# Patient Record
Sex: Male | Born: 1950 | Race: White | Hispanic: No | Marital: Married | Smoking: Never smoker
Health system: Southern US, Community
[De-identification: ages and names within clinical notes are randomized; demographics above are authoritative.]

## PROBLEM LIST (undated history)

## (undated) DIAGNOSIS — E119 Type 2 diabetes mellitus without complications: Secondary | ICD-10-CM

## (undated) DIAGNOSIS — I10 Essential (primary) hypertension: Secondary | ICD-10-CM

## (undated) DIAGNOSIS — E210 Primary hyperparathyroidism: Secondary | ICD-10-CM

## (undated) HISTORY — PX: VASECTOMY: SHX75

## (undated) HISTORY — PX: HERNIA REPAIR: SHX51

## (undated) HISTORY — PX: APPENDECTOMY: SHX54

## (undated) HISTORY — PX: PARATHYROIDECTOMY: SHX19

---

## 2016-03-04 DIAGNOSIS — I10 Essential (primary) hypertension: Secondary | ICD-10-CM | POA: Insufficient documentation

## 2016-03-04 DIAGNOSIS — R399 Unspecified symptoms and signs involving the genitourinary system: Secondary | ICD-10-CM | POA: Insufficient documentation

## 2016-03-05 DIAGNOSIS — Z0000 Encounter for general adult medical examination without abnormal findings: Secondary | ICD-10-CM | POA: Insufficient documentation

## 2016-03-05 DIAGNOSIS — Z1211 Encounter for screening for malignant neoplasm of colon: Secondary | ICD-10-CM | POA: Insufficient documentation

## 2016-03-05 DIAGNOSIS — Z125 Encounter for screening for malignant neoplasm of prostate: Secondary | ICD-10-CM | POA: Insufficient documentation

## 2016-03-05 DIAGNOSIS — N200 Calculus of kidney: Secondary | ICD-10-CM | POA: Insufficient documentation

## 2016-03-18 DIAGNOSIS — R7301 Impaired fasting glucose: Secondary | ICD-10-CM | POA: Insufficient documentation

## 2016-03-18 DIAGNOSIS — Z13220 Encounter for screening for lipoid disorders: Secondary | ICD-10-CM | POA: Insufficient documentation

## 2016-03-19 DIAGNOSIS — R7989 Other specified abnormal findings of blood chemistry: Secondary | ICD-10-CM | POA: Insufficient documentation

## 2016-03-19 DIAGNOSIS — R945 Abnormal results of liver function studies: Secondary | ICD-10-CM | POA: Insufficient documentation

## 2016-03-22 DIAGNOSIS — E782 Mixed hyperlipidemia: Secondary | ICD-10-CM | POA: Insufficient documentation

## 2016-11-15 DIAGNOSIS — F4329 Adjustment disorder with other symptoms: Secondary | ICD-10-CM | POA: Insufficient documentation

## 2016-12-18 ENCOUNTER — Encounter: Payer: Self-pay | Admitting: Sports Medicine

## 2016-12-18 ENCOUNTER — Ambulatory Visit (INDEPENDENT_AMBULATORY_CARE_PROVIDER_SITE_OTHER): Payer: 59 | Admitting: Sports Medicine

## 2016-12-18 DIAGNOSIS — L84 Corns and callosities: Secondary | ICD-10-CM | POA: Diagnosis not present

## 2016-12-18 DIAGNOSIS — M2041 Other hammer toe(s) (acquired), right foot: Secondary | ICD-10-CM | POA: Diagnosis not present

## 2016-12-18 NOTE — Progress Notes (Signed)
  Subjective: Phillip Saunders is a 66 y.o. male patient who presents to office for evaluation of Right foot pain secondary to callus skin at tip of 4th toe. Patient complains of pain at the lesion present Right foot. Patient has tried self trimming with no relief in symptoms. Patient denies any other pedal complaints.   Patient Active Problem List   Diagnosis Date Noted  . Stress and adjustment reaction 11/15/2016  . Hyperlipidemia, mixed 03/22/2016  . Abnormal liver function test 03/19/2016  . Fasting hyperglycemia 03/18/2016  . Screening for lipid disorders 03/18/2016  . Nephrolithiasis 03/05/2016  . Screening for colon cancer 03/05/2016  . Screening for prostate cancer 03/05/2016  . Wellness examination 03/05/2016  . Benign hypertension 03/04/2016  . Lower urinary tract symptoms (LUTS) 03/04/2016    No current outpatient prescriptions on file prior to visit.   No current facility-administered medications on file prior to visit.     No Known Allergies  Objective:  General: Alert and oriented x3 in no acute distress  Dermatology: Keratotic lesion present -at nail margin at lateral aspect of right 4th toe with skin lines transversing the lesion resembling lister corn, pain is present with direct pressure to the lesion with a central nucleated core noted, no webspace macerations, no ecchymosis bilateral, all nails x 10 are well manicured.  Vascular: Dorsalis Pedis and Posterior Tibial pedal pulses 1/4, Capillary Fill Time 3 seconds, + pedal hair growth bilateral, no edema bilateral lower extremities, Temperature gradient within normal limits.  Neurology: Johney Maine sensation intact via light touch bilateral.  Musculoskeletal: Mild tenderness with palpation at the keratotic lesion site on Right with varus hammertoe, Muscular strength 5/5 in all groups without pain or limitation on range of motion.   Assessment and Plan: Problem List Items Addressed This Visit    None    Visit Diagnoses     Corn    -  Primary   Hammertoe of right foot          -Complete examination performed -Discussed treatment options; patient would like to consider derotational arthropathy with possible pin or distal toe amputation  -Parred keratoic lesion using a chisel blade -Gave silicone toe cap and or crest pad to try -Encouraged daily skin emollients -Encouraged use of pumice stone -Advised good supportive shoes and inserts -Patient to return to office as needed or sooner if condition worsens.  Landis Martins, DPM

## 2018-03-12 ENCOUNTER — Ambulatory Visit: Payer: 59 | Admitting: Sports Medicine

## 2018-03-12 ENCOUNTER — Ambulatory Visit (INDEPENDENT_AMBULATORY_CARE_PROVIDER_SITE_OTHER): Payer: 59

## 2018-03-12 ENCOUNTER — Encounter: Payer: Self-pay | Admitting: Sports Medicine

## 2018-03-12 ENCOUNTER — Other Ambulatory Visit: Payer: Self-pay | Admitting: Sports Medicine

## 2018-03-12 DIAGNOSIS — M79671 Pain in right foot: Secondary | ICD-10-CM

## 2018-03-12 DIAGNOSIS — M722 Plantar fascial fibromatosis: Secondary | ICD-10-CM

## 2018-03-12 MED ORDER — TRIAMCINOLONE ACETONIDE 40 MG/ML IJ SUSP: 20.0000 mg | Freq: Once | INTRAMUSCULAR | Status: DC

## 2018-03-12 MED ORDER — DICLOFENAC SODIUM 75 MG PO TBEC: 75.0000 mg | DELAYED_RELEASE_TABLET | Freq: Two times a day (BID) | ORAL | 0 refills | Status: DC

## 2018-03-12 NOTE — Patient Instructions (Signed)

## 2018-03-12 NOTE — Progress Notes (Signed)
Subjective: Phillip Saunders is a 67 y.o. male patient presents to office with complaint of moderate heel pain on the 4 weeks right. Patient admits to post static dyskinesia for 4 weeks in duration.  Reports that pain sometimes is so bad that he cannot even put his heel down worse in the morning rates pain 5 out of 10.  Patient has treated this problem with nothing besides taking his wife's diclofenac which seems to help. Denies any other pedal complaints.   Patient Active Problem List   Diagnosis Date Noted  . Stress and adjustment reaction 11/15/2016  . Hyperlipidemia, mixed 03/22/2016  . Abnormal liver function test 03/19/2016  . Fasting hyperglycemia 03/18/2016  . Screening for lipid disorders 03/18/2016  . Nephrolithiasis 03/05/2016  . Screening for colon cancer 03/05/2016  . Screening for prostate cancer 03/05/2016  . Wellness examination 03/05/2016  . Benign hypertension 03/04/2016  . Lower urinary tract symptoms (LUTS) 03/04/2016    Current Outpatient Medications on File Prior to Visit  Medication Sig Dispense Refill  . atorvastatin (LIPITOR) 40 MG tablet Take 40 mg by mouth.    . valsartan (DIOVAN) 160 MG tablet      No current facility-administered medications on file prior to visit.     No Known Allergies  Objective: Physical Exam General: The patient is alert and oriented x3 in no acute distress.  Dermatology: Skin is warm, dry and supple bilateral lower extremities. Nails 1-10 are normal. There is no erythema, edema, no eccymosis, no open lesions present. Integument is otherwise unremarkable.  Vascular: Dorsalis Pedis pulse and Posterior Tibial pulse are 1/4 bilateral. Capillary fill time is immediate to all digits.  Neurological: Grossly intact to light touch with an achilles reflex of +2/5 and a  negative Tinel's sign bilateral.  Musculoskeletal: Tenderness to palpation at the medial calcaneal tubercale and through the insertion of the plantar fascia on the  left/right foot. No pain with compression of calcaneus bilateral. No pain with tuning fork to calcaneus bilateral. No pain with calf compression bilateral. There is decreased Ankle joint range of motion bilateral. All other joints range of motion within normal limits bilateral.  Hammertoe right strength 5/5 in all groups bilateral.   Gait: Unassisted, Antalgic avoid weight on right heel   Xray, Righ posterior and inferior t foot:  Normal osseous mineralization. Joint spaces preserved. No fracture/dislocation/boney destruction. Calcaneal spur present with mild thickening of plantar fascia. No other soft tissue abnormalities or radiopaque foreign bodies.   Assessment and Plan: Problem List Items Addressed This Visit    None    Visit Diagnoses    Plantar fasciitis of right foot    -  Primary   Relevant Medications   triamcinolone acetonide (KENALOG-40) injection 20 mg   diclofenac (VOLTAREN) 75 MG EC tablet   Foot pain, right       Relevant Medications   triamcinolone acetonide (KENALOG-40) injection 20 mg   diclofenac (VOLTAREN) 75 MG EC tablet      -Complete examination performed.  -Xrays reviewed -Discussed with patient in detail the condition of plantar fasciitis, how this occurs and general treatment options. Explained both conservative and surgical treatments.  -After oral consent and aseptic prep, injected a mixture containing 1 ml of 2%  plain lidocaine, 1 ml 0.5% plain marcaine, 0.5 ml of kenalog 40 and 0.5 ml of dexamethasone phosphate into right heel. Post-injection care discussed with patient.  -Rx Diclofenac to take as instructed -Recommended good supportive shoes and advised use of OTC insert. Explained  to patient that if these orthoses work well, we will continue with these. If these do not improve his condition and  pain, we will consider custom molded orthoses. -Dispensed heel lifts to use as instructed -Explained and dispensed to patient daily stretching  exercises. -Recommend patient to ice affected area 1-2x daily. -Patient to return to office in 4 weeks for follow up or sooner if problems or questions arise.  If pain is still present we will consider reinjection and plantar fascial bracing at next visit.  Landis Martins, DPM

## 2018-04-02 ENCOUNTER — Encounter: Payer: Self-pay | Admitting: Sports Medicine

## 2018-04-02 ENCOUNTER — Ambulatory Visit: Payer: 59 | Admitting: Sports Medicine

## 2018-04-02 DIAGNOSIS — M79671 Pain in right foot: Secondary | ICD-10-CM

## 2018-04-02 DIAGNOSIS — M722 Plantar fascial fibromatosis: Secondary | ICD-10-CM

## 2018-04-02 NOTE — Progress Notes (Signed)
Subjective: Phillip Saunders is a 67 y.o. male returns to office for follow up evaluation after Right heel injection for plantar fasciitis, injection #1 administered 3 weeks ago. Patient states that the injection seems to help his pain which is now resolved.  Patient reports that he finished his diclofenac by mouth and was able to tolerate the medicine very well with no problems or issues.  Reports that he has been compliant with wearing good supportive shoes even when in home and has purchase a new pair for the house.  Denies any recurrence of pain, bruising, swelling, warmth, or any other constitutional symptoms at this time.  Patient denies any recent changes in medications or new problems since last visit.   Patient Active Problem List   Diagnosis Date Noted  . Stress and adjustment reaction 11/15/2016  . Hyperlipidemia, mixed 03/22/2016  . Abnormal liver function test 03/19/2016  . Fasting hyperglycemia 03/18/2016  . Screening for lipid disorders 03/18/2016  . Nephrolithiasis 03/05/2016  . Screening for colon cancer 03/05/2016  . Screening for prostate cancer 03/05/2016  . Wellness examination 03/05/2016  . Benign hypertension 03/04/2016  . Lower urinary tract symptoms (LUTS) 03/04/2016    Current Outpatient Medications on File Prior to Visit  Medication Sig Dispense Refill  . atorvastatin (LIPITOR) 40 MG tablet Take 40 mg by mouth.    . diclofenac (VOLTAREN) 75 MG EC tablet Take 1 tablet (75 mg total) by mouth 2 (two) times daily. 30 tablet 0  . valsartan (DIOVAN) 160 MG tablet      Current Facility-Administered Medications on File Prior to Visit  Medication Dose Route Frequency Provider Last Rate Last Dose  . triamcinolone acetonide (KENALOG-40) injection 20 mg  20 mg Other Once Landis Martins, DPM        No Known Allergies  Objective:   General:  Alert and oriented x 3, in no acute distress  Dermatology: Skin is warm, dry, and supple bilateral. Nails are within normal limits.  There is no lower extremity erythema, no eccymosis, no open lesions present bilateral.   Vascular: Dorsalis Pedis and Posterior Tibial pedal pulses are 2/4 bilateral. + hair growth noted bilateral. Capillary Fill Time is 3 seconds in all digits. No varicosities, No edema bilateral lower extremities.   Neurological: Sensation grossly intact to light touch with an achilles reflex of +2 and a  negative Tinel's sign bilateral. Vibratory, sharp/dull, Semmes Weinstein Monofilament within normal limits.   Musculoskeletal: There is no tenderness to palpation at the medial calcaneal tubercale and through the insertion of the plantar fascia on the right foot. No pain with compression to calcaneus or application of tuning fork. There is decreased Ankle joint range of motion bilateral. All other jointsrange of motion  within normal limits bilateral. Strength 5/5 bilateral.   Assessment and Plan: Problem List Items Addressed This Visit    None    Visit Diagnoses    Plantar fasciitis of right foot    -  Primary   Foot pain, right          -Complete examination performed.  -Previous x-rays reviewed. -Re-Discussed with patient in detail the condition of plantar fasciitis, how this  occurs related to the foot type of the patient and general treatment options. -Discussed habits for prevention of recurrence of plantar fasciitis -No reinjection at this visit since pain and symptoms are resolved -Continue with stretching, icing as needed, and good supportive shoes, inserts daily.  -Advised patient if pain recurs to resume icing and return to  office as soon as possible -Patient to return to office as needed or sooner if problems or questions arise.  Landis Martins, DPM

## 2021-07-06 ENCOUNTER — Other Ambulatory Visit: Payer: Self-pay

## 2021-07-06 ENCOUNTER — Inpatient Hospital Stay (HOSPITAL_COMMUNITY)
Admission: EM | Admit: 2021-07-06 | Discharge: 2021-07-14 | DRG: 435 | Disposition: A | Discharge status: Home / Self Care | Payer: Medicare HMO | Attending: Internal Medicine | Admitting: Internal Medicine

## 2021-07-06 ENCOUNTER — Encounter (HOSPITAL_COMMUNITY): Payer: Self-pay | Admitting: Emergency Medicine

## 2021-07-06 ENCOUNTER — Emergency Department (HOSPITAL_COMMUNITY): Payer: Medicare HMO

## 2021-07-06 DIAGNOSIS — D751 Secondary polycythemia: Secondary | ICD-10-CM | POA: Diagnosis not present

## 2021-07-06 DIAGNOSIS — K7469 Other cirrhosis of liver: Principal | ICD-10-CM | POA: Diagnosis present

## 2021-07-06 DIAGNOSIS — R978 Other abnormal tumor markers: Secondary | ICD-10-CM

## 2021-07-06 DIAGNOSIS — R7303 Prediabetes: Secondary | ICD-10-CM | POA: Diagnosis present

## 2021-07-06 DIAGNOSIS — K766 Portal hypertension: Secondary | ICD-10-CM | POA: Diagnosis present

## 2021-07-06 DIAGNOSIS — E861 Hypovolemia: Secondary | ICD-10-CM | POA: Diagnosis present

## 2021-07-06 DIAGNOSIS — K838 Other specified diseases of biliary tract: Secondary | ICD-10-CM | POA: Diagnosis present

## 2021-07-06 DIAGNOSIS — N179 Acute kidney failure, unspecified: Secondary | ICD-10-CM | POA: Diagnosis not present

## 2021-07-06 DIAGNOSIS — Z20822 Contact with and (suspected) exposure to covid-19: Secondary | ICD-10-CM | POA: Diagnosis present

## 2021-07-06 DIAGNOSIS — R17 Unspecified jaundice: Secondary | ICD-10-CM

## 2021-07-06 DIAGNOSIS — E871 Hypo-osmolality and hyponatremia: Secondary | ICD-10-CM | POA: Diagnosis present

## 2021-07-06 DIAGNOSIS — R970 Elevated carcinoembryonic antigen [CEA]: Secondary | ICD-10-CM

## 2021-07-06 DIAGNOSIS — Z7984 Long term (current) use of oral hypoglycemic drugs: Secondary | ICD-10-CM

## 2021-07-06 DIAGNOSIS — E669 Obesity, unspecified: Secondary | ICD-10-CM | POA: Diagnosis present

## 2021-07-06 DIAGNOSIS — K859 Acute pancreatitis without necrosis or infection, unspecified: Secondary | ICD-10-CM | POA: Diagnosis not present

## 2021-07-06 DIAGNOSIS — E872 Acidosis: Secondary | ICD-10-CM | POA: Diagnosis not present

## 2021-07-06 DIAGNOSIS — C221 Intrahepatic bile duct carcinoma: Principal | ICD-10-CM | POA: Diagnosis present

## 2021-07-06 DIAGNOSIS — R161 Splenomegaly, not elsewhere classified: Secondary | ICD-10-CM | POA: Diagnosis present

## 2021-07-06 DIAGNOSIS — R109 Unspecified abdominal pain: Secondary | ICD-10-CM

## 2021-07-06 DIAGNOSIS — Z803 Family history of malignant neoplasm of breast: Secondary | ICD-10-CM

## 2021-07-06 DIAGNOSIS — K8021 Calculus of gallbladder without cholecystitis with obstruction: Secondary | ICD-10-CM | POA: Diagnosis present

## 2021-07-06 DIAGNOSIS — R1010 Upper abdominal pain, unspecified: Secondary | ICD-10-CM

## 2021-07-06 DIAGNOSIS — R160 Hepatomegaly, not elsewhere classified: Secondary | ICD-10-CM

## 2021-07-06 DIAGNOSIS — Z79899 Other long term (current) drug therapy: Secondary | ICD-10-CM

## 2021-07-06 DIAGNOSIS — D696 Thrombocytopenia, unspecified: Secondary | ICD-10-CM | POA: Diagnosis present

## 2021-07-06 DIAGNOSIS — R7401 Elevation of levels of liver transaminase levels: Secondary | ICD-10-CM

## 2021-07-06 DIAGNOSIS — K7460 Unspecified cirrhosis of liver: Secondary | ICD-10-CM | POA: Diagnosis present

## 2021-07-06 DIAGNOSIS — C220 Liver cell carcinoma: Secondary | ICD-10-CM | POA: Diagnosis present

## 2021-07-06 DIAGNOSIS — E1165 Type 2 diabetes mellitus with hyperglycemia: Secondary | ICD-10-CM | POA: Diagnosis present

## 2021-07-06 DIAGNOSIS — E210 Primary hyperparathyroidism: Secondary | ICD-10-CM | POA: Diagnosis present

## 2021-07-06 DIAGNOSIS — R110 Nausea: Secondary | ICD-10-CM

## 2021-07-06 DIAGNOSIS — I10 Essential (primary) hypertension: Secondary | ICD-10-CM | POA: Diagnosis present

## 2021-07-06 DIAGNOSIS — E785 Hyperlipidemia, unspecified: Secondary | ICD-10-CM | POA: Diagnosis present

## 2021-07-06 DIAGNOSIS — K839 Disease of biliary tract, unspecified: Secondary | ICD-10-CM | POA: Diagnosis present

## 2021-07-06 DIAGNOSIS — E119 Type 2 diabetes mellitus without complications: Secondary | ICD-10-CM

## 2021-07-06 DIAGNOSIS — E860 Dehydration: Secondary | ICD-10-CM | POA: Diagnosis present

## 2021-07-06 HISTORY — DX: Essential (primary) hypertension: I10

## 2021-07-06 HISTORY — DX: Type 2 diabetes mellitus without complications: E11.9

## 2021-07-06 HISTORY — DX: Primary hyperparathyroidism: E21.0

## 2021-07-06 LAB — CBC WITH DIFFERENTIAL/PLATELET
Abs Immature Granulocytes: 0.02 10*3/uL (ref 0.00–0.07)
Basophils Absolute: 0 10*3/uL (ref 0.0–0.1)
Basophils Relative: 1 %
Eosinophils Absolute: 0.1 10*3/uL (ref 0.0–0.5)
Eosinophils Relative: 2 %
HCT: 43.1 % (ref 39.0–52.0)
Hemoglobin: 14.7 g/dL (ref 13.0–17.0)
Immature Granulocytes: 0 %
Lymphocytes Relative: 15 %
Lymphs Abs: 0.8 10*3/uL (ref 0.7–4.0)
MCH: 32.7 pg (ref 26.0–34.0)
MCHC: 34.1 g/dL (ref 30.0–36.0)
MCV: 96 fL (ref 80.0–100.0)
Monocytes Absolute: 0.5 10*3/uL (ref 0.1–1.0)
Monocytes Relative: 9 %
Neutro Abs: 3.8 10*3/uL (ref 1.7–7.7)
Neutrophils Relative %: 73 %
Platelets: 90 10*3/uL — ABNORMAL LOW (ref 150–400)
RBC: 4.49 MIL/uL (ref 4.22–5.81)
RDW: 14 % (ref 11.5–15.5)
WBC: 5.2 10*3/uL (ref 4.0–10.5)
nRBC: 0 % (ref 0.0–0.2)

## 2021-07-06 LAB — COMPREHENSIVE METABOLIC PANEL
ALT: 149 U/L — ABNORMAL HIGH (ref 0–44)
AST: 126 U/L — ABNORMAL HIGH (ref 15–41)
Albumin: 3.1 g/dL — ABNORMAL LOW (ref 3.5–5.0)
Alkaline Phosphatase: 229 U/L — ABNORMAL HIGH (ref 38–126)
Anion gap: 7 (ref 5–15)
BUN: 16 mg/dL (ref 8–23)
CO2: 22 mmol/L (ref 22–32)
Calcium: 8.5 mg/dL — ABNORMAL LOW (ref 8.9–10.3)
Chloride: 102 mmol/L (ref 98–111)
Creatinine, Ser: 0.85 mg/dL (ref 0.61–1.24)
GFR, Estimated: >60 mL/min (ref >60)
Glucose, Bld: 173 mg/dL — ABNORMAL HIGH (ref 70–99)
Potassium: 3.5 mmol/L (ref 3.5–5.1)
Sodium: 131 mmol/L — ABNORMAL LOW (ref 135–145)
Total Bilirubin: 7 mg/dL — ABNORMAL HIGH (ref 0.3–1.2)
Total Protein: 7.3 g/dL (ref 6.5–8.1)

## 2021-07-06 LAB — URINALYSIS, ROUTINE W REFLEX MICROSCOPIC
Glucose, UA: NEGATIVE mg/dL
Hgb urine dipstick: NEGATIVE
Ketones, ur: NEGATIVE mg/dL
Nitrite: NEGATIVE
Protein, ur: NEGATIVE mg/dL
Specific Gravity, Urine: 1.039 — ABNORMAL HIGH (ref 1.005–1.030)
pH: 5 (ref 5.0–8.0)

## 2021-07-06 LAB — RESP PANEL BY RT-PCR (FLU A&B, COVID) ARPGX2
Influenza A by PCR: NEGATIVE
Influenza B by PCR: NEGATIVE
SARS Coronavirus 2 by RT PCR: NEGATIVE

## 2021-07-06 LAB — LIPASE, BLOOD: Lipase: 51 U/L (ref 11–51)

## 2021-07-06 MED ORDER — LACTATED RINGERS IV SOLN: INTRAVENOUS | Status: DC

## 2021-07-06 MED ORDER — INSULIN ASPART 100 UNIT/ML IJ SOLN: 0.0000 [IU] | Freq: Three times a day (TID) | INTRAMUSCULAR | Status: DC

## 2021-07-06 MED ORDER — IOHEXOL 350 MG/ML SOLN
100.0000 mL | Freq: Once | INTRAVENOUS | Status: AC | PRN
  Administered 2021-07-06: 100 mL via INTRAVENOUS

## 2021-07-06 MED ORDER — IRBESARTAN 150 MG PO TABS
150.0000 mg | ORAL_TABLET | Freq: Every day | ORAL | Status: DC
  Administered 2021-07-07 – 2021-07-12 (×5): 150 mg via ORAL
  Filled 2021-07-06 (×5): qty 1

## 2021-07-06 MED ORDER — SODIUM CHLORIDE 0.9 % IV BOLUS
1000.0000 mL | Freq: Once | INTRAVENOUS | Status: AC
  Administered 2021-07-06: 1000 mL via INTRAVENOUS

## 2021-07-06 MED ORDER — ONDANSETRON HCL 4 MG/2ML IJ SOLN
4.0000 mg | Freq: Once | INTRAMUSCULAR | Status: AC
  Administered 2021-07-06: 4 mg via INTRAVENOUS
  Filled 2021-07-06: qty 2

## 2021-07-06 MED ORDER — ATORVASTATIN CALCIUM 40 MG PO TABS
40.0000 mg | ORAL_TABLET | Freq: Every day | ORAL | Status: DC
  Administered 2021-07-06 – 2021-07-13 (×8): 40 mg via ORAL
  Filled 2021-07-06 (×8): qty 1

## 2021-07-06 NOTE — ED Triage Notes (Signed)
Pt here from home after not feeling well w/ diarrhea for 3 days, having abd pain when he eats, increased fatigue. Today pt noticed yellowing of his eyes. Reports general malaise.

## 2021-07-06 NOTE — ED Provider Notes (Signed)
Emergency Medicine Provider Triage Evaluation Note  Phillip Saunders , a 70 y.o. male  was evaluated in triage.  Pt complains of fatigue denies any fever or chills.  Denies any vomiting but does endorse some occasional nausea.  Endorses 2 episodes of diarrhea per day.  States that his loose stool not watery.  Denies any blood in his stool.  Review of Systems  Positive: Nausea, diarrhea, fatigue Negative: Fever  Physical Exam  BP 121/84 (BP Location: Left Arm)   Pulse (!) 59   Temp 99.2 F (37.3 C)   Resp 18   Ht '5\' 3"'$  (1.6 m)   Wt 88.9 kg   SpO2 96%   BMI 34.72 kg/m  Gen:   Awake, no distress   Resp:  Normal effort  MSK:   Moves extremities without difficulty  Other:  Slight icterus.  No significant abdominal tenderness examination.  Medical Decision Making  Medically screening exam initiated at 7:03 PM.  Appropriate orders placed.  Kazumi Brumbley was informed that the remainder of the evaluation will be completed by another provider, this initial triage assessment does not replace that evaluation, and the importance of remaining in the ED until their evaluation is complete.  Is complaining of icterus which is changed on my exam but he states this is new for him.  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  No recent travel no fevers not having significant abdominal pain.  Overall relatively well-appearing.  Vital signs without any tachycardia or hyperthermia.   Tedd Sias, [place] 07/06/21 1914    Arnaldo Natal, MD 07/06/21 [phone]

## 2021-07-06 NOTE — ED Notes (Signed)
Patient transported to CT 

## 2021-07-06 NOTE — ED Provider Notes (Signed)
[facility] EMERGENCY DEPARTMENT Provider Note   CSN: [id number] Arrival date & time: 07/06/21  [id number]     History Chief Complaint  Patient presents with   Jaundice   Diarrhea    Phillip Saunders is a 70 y.o. male.  Patient is a 70 yo male with PMH of HTN, pre-diabetes, and hyperlipidemia presenting for jaundice. Patient admits to generalized abdominal discomfort, nausea without vomiting, and diarrhea x 4 days. Wife noticed discoloration-yellowing of eyes and skin tone this morning. Patient denies previous hx of liver malignancy, family hx of pancreatic cancer, previous dx of hepatitis, daily or binge drinking.     The history is provided by the patient. No language interpreter was used.  Emesis Severity:  Mild Duration:  4 days Timing:  Intermittent Progression:  Unchanged Associated symptoms: no cough and no sore throat       Past Medical History:  Diagnosis Date   Hypertension    Pre-diabetes     Patient Active Problem List   Diagnosis Date Noted   Stress and adjustment reaction 11/15/2016   Hyperlipidemia, mixed 03/22/2016   Abnormal liver function test 03/19/2016   Fasting hyperglycemia 03/18/2016   Screening for lipid disorders 03/18/2016   Nephrolithiasis 03/05/2016   Screening for colon cancer 03/05/2016   Screening for prostate cancer 03/05/2016   Wellness examination 03/05/2016   Benign hypertension 03/04/2016   Lower urinary tract symptoms (LUTS) 03/04/2016    Past Surgical History:  Procedure Laterality Date   HERNIA REPAIR         History reviewed. No pertinent family history.  Social History   Tobacco Use   Smoking status: Never   Smokeless tobacco: Never  Substance Use Topics   Alcohol use: Not Currently   Drug use: Never    Home Medications Prior to Admission medications   Medication Sig Start Date End Date Taking? Authorizing Provider  atorvastatin (LIPITOR) 40 MG tablet Take 40 mg by mouth. 03/22/16   [provider]  diclofenac (VOLTAREN) 75 MG EC tablet Take 1 tablet (75 mg total) by mouth 2 (two) times daily. 03/12/18   Landis Martins, DPM  valsartan (DIOVAN) 160 MG tablet  08/17/16   [provider]    Allergies    Patient has no known allergies.  Review of Systems   Review of Systems  HENT:  Negative for ear pain and sore throat.   Eyes:  Negative for pain and visual disturbance.  Respiratory:  Negative for cough and shortness of breath.   Cardiovascular:  Negative for chest pain and palpitations.  Gastrointestinal:  Positive for nausea and vomiting.  Genitourinary:  Negative for dysuria and hematuria.  Musculoskeletal:  Negative for back pain.  Skin:  Positive for color change. Negative for rash.  Neurological:  Negative for seizures and syncope.  All other systems reviewed and are negative.  Physical Exam Updated Vital Signs BP 121/84 (BP Location: Left Arm)   Pulse (!) 59   Temp 99.2 F (37.3 C)   Resp 18   Ht '5\' 3"'$  (1.6 m)   Wt 88.9 kg   SpO2 96%   BMI 34.72 kg/m   Physical Exam Vitals and nursing note reviewed.  Constitutional:      Appearance: He is well-developed.  HENT:     Head: Normocephalic and atraumatic.  Eyes:     General: Scleral icterus present.     Conjunctiva/sclera: Conjunctivae normal.  Cardiovascular:     Rate and Rhythm: Normal rate and regular  rhythm.     Heart sounds: No murmur heard. Pulmonary:     Effort: Pulmonary effort is normal. No respiratory distress.     Breath sounds: Normal breath sounds.  Abdominal:     Palpations: Abdomen is soft.     Tenderness: There is no abdominal tenderness.  Musculoskeletal:     Cervical back: Neck supple.  Skin:    General: Skin is warm and dry.     Coloration: Skin is jaundiced.  Neurological:     Mental Status: He is alert.    ED Results / Procedures / Treatments   Labs (all labs ordered are listed, but only abnormal results are displayed) Labs Reviewed  RESP PANEL BY RT-PCR  (FLU A&B, COVID) ARPGX2  COMPREHENSIVE METABOLIC PANEL  CBC WITH DIFFERENTIAL/PLATELET  LIPASE, BLOOD  HEPATITIS PANEL, ACUTE  URINALYSIS, ROUTINE W REFLEX MICROSCOPIC    EKG None  Radiology No results found.  Procedures Procedures   Medications Ordered in ED Medications  ondansetron (ZOFRAN) injection 4 mg (has no administration in time range)  sodium chloride 0.9 % bolus 1,000 mL (has no administration in time range)    ED Course  I have reviewed the triage vital signs and the nursing notes.  Pertinent labs & imaging results that were available during my care of the patient were reviewed by me and considered in my medical decision making (see chart for details).    MDM Rules/Calculators/A&P                         8:04 PM  70 yo male with PMH of HTN, pre-diabetes, and hyperlipidemia presenting for jaundice. Patient is Aox3, no acute distress, afebrile, with stable vitals. Abdomen is soft and nontender.    -Lab studies pertinent for transaminitis. Elevated total bilirubin at 7.   -CT abdomen demonstrates: Liver cirrhosis. Interim development of moderate intra hepatic biliary dilatation. Poorly visible extrahepatic common bile duct which does not appear dilated at head of pancreas, findings raise concern for hilar obstructing process though no gross mass seen on CT. There is heterogeneous hyperenhancement of right hepatic lobe indeterminate for mass or altered perfusion. Further evaluation with MRI/MRCP is recommended. 2. Mild porta hepatis adenopathy indeterminate for metastatic disease. 3. Splenomegaly and findings suggestive of portal hypertension  Family up to date. I spoke with GI specialist Dr. Lyndel Safe who recommends MRCP tomorrow. I spoke with hospitalist Dr. Alcario Drought who agrees to accept patient.     Final Clinical Impression(s) / ED Diagnoses Final diagnoses:  Other cirrhosis of liver ([place])  Splenomegaly  Transaminitis  Nausea  Jaundice    Rx / DC  Orders ED Discharge Orders     None        Lianne Cure, DO [id number]

## 2021-07-06 NOTE — ED Triage Notes (Signed)
No answer when name called in waiting

## 2021-07-07 ENCOUNTER — Inpatient Hospital Stay (HOSPITAL_COMMUNITY): Payer: Medicare HMO

## 2021-07-07 DIAGNOSIS — N179 Acute kidney failure, unspecified: Secondary | ICD-10-CM | POA: Diagnosis not present

## 2021-07-07 DIAGNOSIS — E785 Hyperlipidemia, unspecified: Secondary | ICD-10-CM | POA: Diagnosis present

## 2021-07-07 DIAGNOSIS — E860 Dehydration: Secondary | ICD-10-CM | POA: Diagnosis present

## 2021-07-07 DIAGNOSIS — C221 Intrahepatic bile duct carcinoma: Secondary | ICD-10-CM | POA: Diagnosis present

## 2021-07-07 DIAGNOSIS — Z79899 Other long term (current) drug therapy: Secondary | ICD-10-CM | POA: Diagnosis not present

## 2021-07-07 DIAGNOSIS — Z7984 Long term (current) use of oral hypoglycemic drugs: Secondary | ICD-10-CM | POA: Diagnosis not present

## 2021-07-07 DIAGNOSIS — K766 Portal hypertension: Secondary | ICD-10-CM | POA: Diagnosis present

## 2021-07-07 DIAGNOSIS — K851 Biliary acute pancreatitis without necrosis or infection: Secondary | ICD-10-CM | POA: Diagnosis not present

## 2021-07-07 DIAGNOSIS — R1010 Upper abdominal pain, unspecified: Secondary | ICD-10-CM | POA: Diagnosis not present

## 2021-07-07 DIAGNOSIS — R17 Unspecified jaundice: Secondary | ICD-10-CM

## 2021-07-07 DIAGNOSIS — E210 Primary hyperparathyroidism: Secondary | ICD-10-CM | POA: Diagnosis present

## 2021-07-07 DIAGNOSIS — K7460 Unspecified cirrhosis of liver: Secondary | ICD-10-CM

## 2021-07-07 DIAGNOSIS — R7401 Elevation of levels of liver transaminase levels: Secondary | ICD-10-CM | POA: Diagnosis not present

## 2021-07-07 DIAGNOSIS — R160 Hepatomegaly, not elsewhere classified: Secondary | ICD-10-CM | POA: Diagnosis not present

## 2021-07-07 DIAGNOSIS — D751 Secondary polycythemia: Secondary | ICD-10-CM | POA: Diagnosis not present

## 2021-07-07 DIAGNOSIS — K859 Acute pancreatitis without necrosis or infection, unspecified: Secondary | ICD-10-CM | POA: Diagnosis not present

## 2021-07-07 DIAGNOSIS — Z803 Family history of malignant neoplasm of breast: Secondary | ICD-10-CM | POA: Diagnosis not present

## 2021-07-07 DIAGNOSIS — K838 Other specified diseases of biliary tract: Secondary | ICD-10-CM

## 2021-07-07 DIAGNOSIS — Z20822 Contact with and (suspected) exposure to covid-19: Secondary | ICD-10-CM | POA: Diagnosis present

## 2021-07-07 DIAGNOSIS — E871 Hypo-osmolality and hyponatremia: Secondary | ICD-10-CM | POA: Diagnosis present

## 2021-07-07 DIAGNOSIS — R161 Splenomegaly, not elsewhere classified: Secondary | ICD-10-CM | POA: Diagnosis present

## 2021-07-07 DIAGNOSIS — E119 Type 2 diabetes mellitus without complications: Secondary | ICD-10-CM

## 2021-07-07 DIAGNOSIS — R7303 Prediabetes: Secondary | ICD-10-CM | POA: Diagnosis present

## 2021-07-07 DIAGNOSIS — E861 Hypovolemia: Secondary | ICD-10-CM | POA: Diagnosis present

## 2021-07-07 DIAGNOSIS — R970 Elevated carcinoembryonic antigen [CEA]: Secondary | ICD-10-CM | POA: Diagnosis not present

## 2021-07-07 DIAGNOSIS — K839 Disease of biliary tract, unspecified: Secondary | ICD-10-CM | POA: Diagnosis present

## 2021-07-07 DIAGNOSIS — R978 Other abnormal tumor markers: Secondary | ICD-10-CM | POA: Diagnosis not present

## 2021-07-07 DIAGNOSIS — D696 Thrombocytopenia, unspecified: Secondary | ICD-10-CM | POA: Diagnosis present

## 2021-07-07 DIAGNOSIS — E872 Acidosis: Secondary | ICD-10-CM | POA: Diagnosis not present

## 2021-07-07 DIAGNOSIS — E669 Obesity, unspecified: Secondary | ICD-10-CM | POA: Diagnosis present

## 2021-07-07 DIAGNOSIS — E1165 Type 2 diabetes mellitus with hyperglycemia: Secondary | ICD-10-CM | POA: Diagnosis present

## 2021-07-07 DIAGNOSIS — K8021 Calculus of gallbladder without cholecystitis with obstruction: Secondary | ICD-10-CM | POA: Diagnosis present

## 2021-07-07 DIAGNOSIS — K7469 Other cirrhosis of liver: Secondary | ICD-10-CM

## 2021-07-07 DIAGNOSIS — I10 Essential (primary) hypertension: Secondary | ICD-10-CM

## 2021-07-07 DIAGNOSIS — C220 Liver cell carcinoma: Secondary | ICD-10-CM | POA: Diagnosis present

## 2021-07-07 DIAGNOSIS — R109 Unspecified abdominal pain: Secondary | ICD-10-CM | POA: Diagnosis not present

## 2021-07-07 LAB — CBC
HCT: 41.9 % (ref 39.0–52.0)
Hemoglobin: 14.4 g/dL (ref 13.0–17.0)
MCH: 32.5 pg (ref 26.0–34.0)
MCHC: 34.4 g/dL (ref 30.0–36.0)
MCV: 94.6 fL (ref 80.0–100.0)
Platelets: 75 10*3/uL — ABNORMAL LOW (ref 150–400)
RBC: 4.43 MIL/uL (ref 4.22–5.81)
RDW: 14 % (ref 11.5–15.5)
WBC: 4.5 10*3/uL (ref 4.0–10.5)
nRBC: 0 % (ref 0.0–0.2)

## 2021-07-07 LAB — COMPREHENSIVE METABOLIC PANEL
ALT: 143 U/L — ABNORMAL HIGH (ref 0–44)
AST: 125 U/L — ABNORMAL HIGH (ref 15–41)
Albumin: 2.8 g/dL — ABNORMAL LOW (ref 3.5–5.0)
Alkaline Phosphatase: 237 U/L — ABNORMAL HIGH (ref 38–126)
Anion gap: 9 (ref 5–15)
BUN: 12 mg/dL (ref 8–23)
CO2: 21 mmol/L — ABNORMAL LOW (ref 22–32)
Calcium: 8.2 mg/dL — ABNORMAL LOW (ref 8.9–10.3)
Chloride: 102 mmol/L (ref 98–111)
Creatinine, Ser: 0.82 mg/dL (ref 0.61–1.24)
GFR, Estimated: >60 mL/min (ref >60)
Glucose, Bld: 245 mg/dL — ABNORMAL HIGH (ref 70–99)
Potassium: 3.4 mmol/L — ABNORMAL LOW (ref 3.5–5.1)
Sodium: 132 mmol/L — ABNORMAL LOW (ref 135–145)
Total Bilirubin: 7.9 mg/dL — ABNORMAL HIGH (ref 0.3–1.2)
Total Protein: 6.7 g/dL (ref 6.5–8.1)

## 2021-07-07 LAB — HIV ANTIBODY (ROUTINE TESTING W REFLEX): HIV Screen 4th Generation wRfx: NONREACTIVE

## 2021-07-07 LAB — AMMONIA: Ammonia: 76 umol/L — ABNORMAL HIGH (ref 9–35)

## 2021-07-07 LAB — HEPATITIS PANEL, ACUTE
HCV Ab: NONREACTIVE
Hep A IgM: NONREACTIVE
Hep B C IgM: NONREACTIVE
Hepatitis B Surface Ag: NONREACTIVE

## 2021-07-07 LAB — GLUCOSE, CAPILLARY
Glucose-Capillary: 223 mg/dL — ABNORMAL HIGH (ref 70–99)
Glucose-Capillary: 144 mg/dL — ABNORMAL HIGH (ref 70–99)
Glucose-Capillary: 284 mg/dL — ABNORMAL HIGH (ref 70–99)
Glucose-Capillary: 184 mg/dL — ABNORMAL HIGH (ref 70–99)
Glucose-Capillary: 175 mg/dL — ABNORMAL HIGH (ref 70–99)

## 2021-07-07 LAB — HEMOGLOBIN A1C
Hgb A1c MFr Bld: 9.3 % — ABNORMAL HIGH (ref 4.8–5.6)
Mean Plasma Glucose: 220.21 mg/dL

## 2021-07-07 LAB — BILIRUBIN, FRACTIONATED(TOT/DIR/INDIR)
Bilirubin, Direct: 3.8 mg/dL — ABNORMAL HIGH (ref 0.0–0.2)
Indirect Bilirubin: 3.2 mg/dL — ABNORMAL HIGH (ref 0.3–0.9)
Total Bilirubin: 7 mg/dL — ABNORMAL HIGH (ref 0.3–1.2)

## 2021-07-07 LAB — FERRITIN: Ferritin: 764 ng/mL — ABNORMAL HIGH (ref 24–336)

## 2021-07-07 LAB — PROTIME-INR
INR: 1.1 (ref 0.8–1.2)
Prothrombin Time: 14.4 seconds (ref 11.4–15.2)

## 2021-07-07 LAB — APTT: aPTT: 31 seconds (ref 24–36)

## 2021-07-07 MED ORDER — ONDANSETRON HCL 4 MG PO TABS: 4.0000 mg | ORAL_TABLET | Freq: Four times a day (QID) | ORAL | Status: DC | PRN

## 2021-07-07 MED ORDER — INSULIN ASPART 100 UNIT/ML IJ SOLN
2.0000 [IU] | Freq: Three times a day (TID) | INTRAMUSCULAR | Status: DC
  Administered 2021-07-07 – 2021-07-14 (×18): 2 [IU] via SUBCUTANEOUS

## 2021-07-07 MED ORDER — ACETAMINOPHEN 325 MG PO TABS
650.0000 mg | ORAL_TABLET | Freq: Four times a day (QID) | ORAL | Status: DC | PRN
  Administered 2021-07-10 – 2021-07-11 (×3): 650 mg via ORAL
  Filled 2021-07-07 (×3): qty 2

## 2021-07-07 MED ORDER — ENOXAPARIN SODIUM 40 MG/0.4ML IJ SOSY
40.0000 mg | PREFILLED_SYRINGE | Freq: Every day | INTRAMUSCULAR | Status: DC
  Administered 2021-07-07 – 2021-07-08 (×2): 40 mg via SUBCUTANEOUS
  Filled 2021-07-07 (×2): qty 0.4

## 2021-07-07 MED ORDER — LACTULOSE 10 GM/15ML PO SOLN
20.0000 g | Freq: Two times a day (BID) | ORAL | Status: DC
  Administered 2021-07-07 (×2): 20 g via ORAL
  Filled 2021-07-07 (×3): qty 30

## 2021-07-07 MED ORDER — GADOBUTROL 1 MMOL/ML IV SOLN
9.0000 mL | Freq: Once | INTRAVENOUS | Status: AC | PRN
  Administered 2021-07-07: 9 mL via INTRAVENOUS

## 2021-07-07 MED ORDER — ACETAMINOPHEN 650 MG RE SUPP: 650.0000 mg | Freq: Four times a day (QID) | RECTAL | Status: DC | PRN

## 2021-07-07 MED ORDER — SODIUM CHLORIDE 0.9 % IV SOLN: INTRAVENOUS | Status: AC

## 2021-07-07 MED ORDER — LORAZEPAM 1 MG PO TABS
1.0000 mg | ORAL_TABLET | Freq: Four times a day (QID) | ORAL | Status: DC | PRN
  Administered 2021-07-07 – 2021-07-13 (×2): 1 mg via ORAL
  Filled 2021-07-07 (×2): qty 1

## 2021-07-07 MED ORDER — INSULIN ASPART 100 UNIT/ML IJ SOLN: 0.0000 [IU] | Freq: Every day | INTRAMUSCULAR | Status: DC

## 2021-07-07 MED ORDER — ONDANSETRON HCL 4 MG/2ML IJ SOLN
4.0000 mg | Freq: Four times a day (QID) | INTRAMUSCULAR | Status: DC | PRN
  Administered 2021-07-10: 4 mg via INTRAVENOUS
  Filled 2021-07-07: qty 2

## 2021-07-07 MED ORDER — INSULIN ASPART 100 UNIT/ML IJ SOLN
0.0000 [IU] | INTRAMUSCULAR | Status: DC
  Administered 2021-07-07: 3 [IU] via SUBCUTANEOUS

## 2021-07-07 MED ORDER — INSULIN ASPART 100 UNIT/ML IJ SOLN
0.0000 [IU] | Freq: Three times a day (TID) | INTRAMUSCULAR | Status: DC
  Administered 2021-07-07: 3 [IU] via SUBCUTANEOUS
  Administered 2021-07-07: 8 [IU] via SUBCUTANEOUS
  Administered 2021-07-08 (×3): 5 [IU] via SUBCUTANEOUS
  Administered 2021-07-09: 3 [IU] via SUBCUTANEOUS
  Administered 2021-07-09 – 2021-07-10 (×3): 2 [IU] via SUBCUTANEOUS
  Administered 2021-07-11 – 2021-07-13 (×6): 3 [IU] via SUBCUTANEOUS
  Administered 2021-07-13: 2 [IU] via SUBCUTANEOUS
  Administered 2021-07-13 – 2021-07-14 (×3): 3 [IU] via SUBCUTANEOUS

## 2021-07-07 NOTE — Consult Note (Addendum)
[facility] Consult: 10:07 AM 07/07/2021  LOS: 0 days    Referring Provider: Dr Reesa Chew  Primary Care Physician:  Algis Greenhouse, MD Primary Gastroenterologist:  [facility] in [place]     Reason for Consultation: Cirrhosis, hepatocellular carcinoma.  Both new diagnosis   HPI: Phillip Saunders is a 70 y.o. male.  PMH NIDDM.  Primary hyperparathyroidism, parathyroidectomy May 08/2017.Marland Kitchen  HTN.  Previous colonoscopy ~ 5 yr  ago, no polyps.  Left inguinal hernia repair.  2 months of postprandial discomfort with eating solid food, okay if he drinks liquids.  Decreased p.o. intake as a result.  20 pound weight loss.  No abdominal or extremity swelling.  Fatigue without shortness of breath.  No pruritus.  Patient does not drink alcohol.  No history of illicit drug use. Yesterday his family noticed for the first time his jaundice especially scleral icterus and they forced him to come to the emergency room for evaluation.  Labs significant for T bili 7.9.  Alk phos 237.  AST/ALT 125/143.  Hb 14.4.  Platelets 75.  INR 1.1.  Glucose 245. Ending labs include CEA, AFP, CA 19-9. Acute hepatitis panel is in progress.  MRI, MRCP:   Somewhat motion degraded exam.  Area of infiltration and hyper intensity measuring 10.1 x 8.7 in segment 8.  This is likely a tumor.  Moderate intrahepatic biliary ductal dilatation in the left lobe and posterior right hepatic lobe presumably from the right lobe tumor.  Small gallstones, no complication.  Splenomegaly at 15.5 cm.  Patent PV and splenic veins.  Extensive portosystemic collaterals.  1.2 cm periesophageal and 1.5 cm gastrohepatic ligament nodes.  No ascites.  Probable mid thoracic and lumbar spine hemangiomas  No family history of cancers, liver disease.  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  Now does part-time work with tractors.   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 Never smoked, never drank alcohol.  Married.  Lives in [place].    Past Medical History:  Diagnosis Date   DM2 (diabetes mellitus, type 2) ([place])    Hypertension    Primary hyperparathyroidism ([place])     Past Surgical History:  Procedure Laterality Date   APPENDECTOMY     HERNIA REPAIR     PARATHYROIDECTOMY     VASECTOMY      Prior to Admission medications   Medication Sig Start Date End Date Taking? Authorizing Provider  atorvastatin (LIPITOR) 40 MG tablet Take 40 mg by mouth at bedtime. 03/22/16  Yes [provider]  irbesartan (AVAPRO) 150 MG tablet Take 150 mg by mouth daily. 05/07/21  Yes [provider]  metFORMIN (GLUCOPHAGE) 1000 MG tablet Take 1,000 mg by mouth 2 (two) times daily with a meal. 06/21/21  Yes [provider]  RYBELSUS 7 MG TABS Take 7 mg by mouth every morning. 06/25/21  Yes [provider]    Scheduled Meds:  atorvastatin  40 mg Oral QHS   enoxaparin (LOVENOX) injection  40 mg Subcutaneous QHS   insulin aspart  0-15  Units Subcutaneous TID WC   insulin aspart  0-5 Units Subcutaneous QHS   insulin aspart  2 Units Subcutaneous TID WC   irbesartan  150 mg Oral Daily   Infusions:  sodium chloride 75 mL/hr at 07/07/21 0907   PRN Meds: acetaminophen **OR** acetaminophen, LORazepam, ondansetron **OR** ondansetron (ZOFRAN) IV   Allergies as of 07/06/2021   (No Known Allergies)    Family History  Problem Relation Age of Onset   Pancreatic cancer Neg Hx     Social History   Socioeconomic History   Marital status: Married    Spouse name: Not on file   Number of children: Not on file   Years of education: Not on file   Highest education level: Not on file  Occupational History   Not on file  Tobacco Use   Smoking status: Never   Smokeless tobacco: Never  Substance and Sexual Activity   Alcohol use:  Not Currently   Drug use: Never   Sexual activity: Not on file  Other Topics Concern   Not on file  Social History Narrative   Not on file   Social Determinants of Health   Financial Resource Strain: Not on file  Food Insecurity: Not on file  Transportation Needs: Not on file  Physical Activity: Not on file  Stress: Not on file  Social Connections: Not on file  Intimate Partner Violence: Not on file    REVIEW OF SYSTEMS: Constitutional: Weakness, fatigue. ENT:  No nose bleeds Pulm: No shortness of breath or cough CV:  No palpitations, no LE edema.  GU:  No hematuria, no frequency GI: See HPI. Heme: No excessive or unusual bleeding or bruising. Transfusions: No transfusions of blood products ever. Neuro:  No headaches, no peripheral tingling or numbness.  No seizures, no syncope. Derm:  No itching, no rash or sores.  Endocrine:  No sweats or chills.  No polyuria or dysuria Immunization: Not queried.    PHYSICAL EXAM: Vital signs in last 24 hours: Vitals:   07/07/21 0623 07/07/21 0809  BP: 115/85 109/82  Pulse: (!) 103 (!) 101  Resp: 18 17  Temp: 98.3 F (36.8 C) 98 F (36.7 C)  SpO2: 93% 94%   Wt Readings from Last 3 Encounters:  07/06/21 88.9 kg    General: Pleasant, healthy-appearing other than the jaundice.  Comfortable.  Alert. Head: No facial asymmetry or swelling.  No signs of head trauma. Eyes: Conjunctiva pink.  Scleral icterus present Ears: Somewhat hard of hearing but not profound hearing loss. Nose: No congestion or discharge Mouth: Good dentition.  Tongue midline.  Mucosa moist, pink, clear. Neck: No JVD, thyromegaly or masses. Lungs: No labored breathing or cough.  Lungs clear with good breath sounds bilaterally. Heart: RRR.  No MRG.  S1, S2 present Abdomen: Nondistended.  Slightly obese.  Do not appreciate HSM, masses, bruits, hernias.   Slight tenderness over in the left upper quadrant but no guarding or rebound.  Active bowel sounds. Rectal:  Deferred Musc/Skeltl: No joint swelling or redness. Extremities: No CCE. Neurologic: Oriented x3.  No asterixis.  Moves all 4 limbs and walks easily without deficits. Skin: Jaundice but also suntanned. Tattoos: None observed. Nodes: No cervical adenopathy. Psych: Calm, pleasant, cooperative, fluid speech.  Intake/Output from previous day: 08/19 0701 - 08/20 0700 In: 1448.5 [I.V.:448.5; IV Piggyback:1000] Out: -  Intake/Output this shift: No intake/output data recorded.  LAB RESULTS: Recent Labs    07/06/21 1925 07/07/21 0205  WBC 5.2 4.5  HGB  14.7 14.4  HCT 43.1 41.9  PLT 90* 75*   BMET Lab Results  Component Value Date   NA 132 (L) 07/07/2021   NA 131 (L) 07/06/2021   K 3.4 (L) 07/07/2021   K 3.5 07/06/2021   CL 102 07/07/2021   CL 102 07/06/2021   CO2 21 (L) 07/07/2021   CO2 22 07/06/2021   GLUCOSE 245 (H) 07/07/2021   GLUCOSE 173 (H) 07/06/2021   BUN 12 07/07/2021   BUN 16 07/06/2021   CREATININE 0.82 07/07/2021   CREATININE 0.85 07/06/2021   CALCIUM 8.2 (L) 07/07/2021   CALCIUM 8.5 (L) 07/06/2021   LFT Recent Labs    07/06/21 1925 07/07/21 0205  PROT 7.3 6.7  ALBUMIN 3.1* 2.8*  AST 126* 125*  ALT 149* 143*  ALKPHOS 229* 237*  BILITOT 7.0* 7.9*   PT/INR Lab Results  Component Value Date   INR 1.1 07/06/2021   Hepatitis Panel No results for input(s): HEPBSAG, HCVAB, HEPAIGM, HEPBIGM in the last 72 hours. C-Diff No components found for: CDIFF Lipase     Component Value Date/Time   LIPASE 51 07/06/2021 1925    Drugs of Abuse  No results found for: LABOPIA, COCAINSCRNUR, LABBENZ, AMPHETMU, THCU, LABBARB   RADIOLOGY STUDIES: CT ABDOMEN PELVIS W CONTRAST  Result Date: 07/06/2021 CLINICAL DATA:  Jaundice EXAM: CT ABDOMEN AND PELVIS WITH CONTRAST TECHNIQUE: Multidetector CT imaging of the abdomen and pelvis was performed using the standard protocol following bolus administration of intravenous contrast. CONTRAST:  137m OMNIPAQUE IOHEXOL 350  MG/ML SOLN COMPARISON:  MRI 07/31/2017, CT 02/06/2012 FINDINGS: Lower chest: Lung bases demonstrate no acute consolidation or effusion. Borderline aneurysmal dilatation of aortic root, similar to 2012. Mild cardiomegaly. Hepatobiliary: Heterogeneous hyperenhancement within the right hepatic lobe. Moderate intra hepatic biliary dilatation. Poorly visible extrahepatic common bile duct, does not appear dilated at head of pancreas. Lobulated liver morphology consistent with cirrhosis. Multiple calcified gallstones. Pancreas: Unremarkable. No pancreatic ductal dilatation or surrounding inflammatory changes. Spleen: Enlarged measuring 15.5 cm Adrenals/Urinary Tract: Adrenal glands are normal. Kidneys show no hydronephrosis. Subcentimeter hypodense lesions too small to further characterize. The bladder is unremarkable Stomach/Bowel: The stomach is nonenlarged. No dilated small bowel. No acute bowel wall thickening. Vascular/Lymphatic: Nonaneurysmal aorta. Multiple enlarged porta hepatis lymph nodes measuring up to 16 mm. Distal esophageal node measuring 10 mm. Numerous small tortuous vessels in the left upper quadrant. Reproductive: Prostate is unremarkable. Other: No free air. No significant ascites. Fat containing right inguinal hernia. Musculoskeletal: No acute osseous abnormality IMPRESSION: 1. Liver cirrhosis. Interim development of moderate intra hepatic biliary dilatation. Poorly visible extrahepatic common bile duct which does not appear dilated at head of pancreas, findings raise concern for hilar obstructing process though no gross mass seen on CT. There is heterogeneous hyperenhancement of right hepatic lobe indeterminate for mass or altered perfusion. Further evaluation with MRI/MRCP is recommended. 2. Mild porta hepatis adenopathy indeterminate for metastatic disease. 3. Splenomegaly and findings suggestive of portal hypertension Electronically Signed   By: KDonavan FoilM.D.   On: 07/06/2021 21:35   MR  ABDOMEN MRCP W WO CONTAST  Result Date: 07/07/2021 CLINICAL DATA:  Recent jaundice. CT demonstrating cirrhosis and possible primary malignancy. EXAM: MRI ABDOMEN WITHOUT AND WITH CONTRAST (INCLUDING MRCP) TECHNIQUE: Multiplanar multisequence MR imaging of the abdomen was performed both before and after the administration of intravenous contrast. Heavily T2-weighted images of the biliary and pancreatic ducts were obtained, and three-dimensional MRCP images were rendered by post processing. CONTRAST:  923mGADAVIST GADOBUTROL  1 MMOL/ML IV SOLN COMPARISON:  CT of 1 day prior FINDINGS: Moderately motion degraded exam. The extent of motion makes LI-RADS characterisation impossible. Lower chest: Mild cardiomegaly, without pericardial or pleural effusion. Hepatobiliary: Marked cirrhosis, with caudate lobe enlargement and irregular hepatic capsule. Centered in segment 8 is infiltrative relatively diffuse T2 hyperintensity on 09/05, corresponding to restricted diffusion on 43/6 and heterogeneous arterial phase hyperenhancement including on 40/1101. Example at on the order of 10.1 x 8.7 cm. The T2 signal and restricted diffusion extend towards the right portal vein, which is not opacified on post-contrast images, and likely involved with tumor. Arterial phase images demonstrate multiple other areas of subcentimeter hyperenhancement, not confidently identified on later post-contrast imaging. Indeterminate. Moderate intrahepatic biliary duct dilatation within the left hepatic lobe and posterior right hepatic lobe, followed to the presumed central right hepatic lobe tumor. Example obstruction on 50 through 53 of 1,103. Small gallstones without specific evidence of acute cholecystitis. No common duct dilatation. Pancreas:  Normal, without mass or ductal dilatation. Spleen:  Splenomegaly at 15.5 cm craniocaudal. Adrenals/Urinary Tract: Normal adrenal glands. Bilateral small renal cysts. No hydronephrosis. Stomach/Bowel: Normal  stomach and abdominal bowel loops. Vascular/Lymphatic: Aortic atherosclerosis. The main portal vein and splenic veins are patent. Extensive portosystemic collaterals. 1.2 cm periesophageal node at the level of the diaphragmatic hiatus on 11/05. Gastrohepatic ligament node measures 1.5 cm on 77/1103. Other:  No ascites. Musculoskeletal: No acute osseous abnormality. Probable hemangiomas within the midthoracic and mid lumbar spine. IMPRESSION: 1. Moderately motion degraded exam, with limitations above. 2. 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 As above, the extent of motion degradation makes LI-Rads categorization impossible. 3. Upper abdominal adenopathy which could be metastatic or reactive. 4. Portal venous hypertension and splenomegaly. 5. Cholelithiasis Electronically Signed   By: Abigail Miyamoto M.D.   On: 07/07/2021 06:25     IMPRESSION:     New dx cirrhosis of undetermined etiology.  Patient was never a drinker.  No history of fatty liver though this may have been under the radar and never picked up.  Rule out metabolic, autoimmune causes for cirrhosis.  Unfortunately also appears to have hepatocellular carcinoma.     Splenomegaly.  Thrombocytopenia with platelets of 70 5K     No coagulopathy.     Hyponatremia.     Elevated ammonia at 76 but nothing in his history or current exam suggests hepatic encephalopathy.  Mild hypokalemia at 3.4.    PLAN:       Ordered IgG.  Mitochondrial, smooth muscle antibodies.  ANA.  Ceruloplasmin.  Alpha-1 antitrypsin.  Ferritin.  Before deciding on percutaneous biopsy of the liver mass await results of pending AFP.  If this is significantly elevated we have our diagnosis and there is no need to confirm this with tissue sample.     Ordered carb modified diet.  May need to add 2 gm Na   restriction if further decline Na.     Azucena Freed  07/07/2021, 10:07 AM Phone [phone]    Attending physician's note   I have taken an interval history, reviewed the chart and examined the patient. I agree with the Advanced Practitioner's note, impression and recommendations. CT/MRCP reviewed.   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 No ascites. No coagulopathy.  Liver mass(10 cm x 9 cm) highly s/o HCC  with R portal vein involvement, perihilar adenopathy  New onset progressive obstructive jaundice with L hepatic duct and posterior right hepatic duct obstruction d/t central right hepatic lobe tumor. Doubt perihilar adenopathy as cause of obstructive jaundice.  Weight loss (20lb over last 6 mts)   Plan: -Check AFP.  Also add CA 19-9, CEA -Liver WU for etiology of cirrhosis (ordered) -If Dx (for liver mass) is still uncertain or AFP in Nl, may need IR liver Bx early next week. -On review of MRCP/CT, it would be challenging to perform ERCP for Obs jaundice given size and intrahepatic nature of the mass.  Certainly, can be attempted.  Need to run it by Dr. Rush Landmark tomorrow. Other choice would be PTC. -For now, trend CBC, CMP -Would also need EGD near discharge for EV screening (esp if he needs avastin etc in future) -D/W pt and pt's wife.     Carmell Austria, MD [facility] [phone]

## 2021-07-07 NOTE — Plan of Care (Signed)
  Problem: Clinical Measurements: Goal: Diagnostic test results will improve Outcome: Progressing   

## 2021-07-07 NOTE — H&P (Signed)
History and Physical    [name] [id number] DOB: 01-02-1951 DOA: 07/06/2021  PCP: Algis Greenhouse, MD  Patient coming from: Home  I have personally briefly reviewed patient'Saunders old medical records in [place]  Chief Complaint: Jaundice  HPI: Phillip Saunders is a 70 y.o. male with medical history significant of DM2, HTN.  Pt presents to ED with c/o nausea, diarrhea x4 days.  Noticed jaundice this AM.  No prior history of known liver disease.  Symptoms constant, worsening.  No h/o EtOH abuse.  No fevers, chills.   ED Course: LFTs abnormal, Bili 7.0.  CT A/P: 1) cirrhosis 2) moderate intra hepatic biliary dilation, ? CBD obstruction 3) may or may not have mass of R hepatic lobe 4) may or may not have lymphnodes mets 5) splenomegaly and findings of portal HTN.   Review of Systems: As per HPI, otherwise all review of systems negative.  Past Medical History:  Diagnosis Date   DM2 (diabetes mellitus, type 2) ([place])    Hypertension    Primary hyperparathyroidism ([place])     Past Surgical History:  Procedure Laterality Date   APPENDECTOMY     HERNIA REPAIR     PARATHYROIDECTOMY     VASECTOMY       reports that he has never smoked. He has never used smokeless tobacco. He reports that he does not currently use alcohol. He reports that he does not use drugs.  No Known Allergies  Family History  Problem Relation Age of Onset   Pancreatic cancer Neg Hx      Prior to Admission medications   Medication Sig Start Date End Date Taking? Authorizing Provider  atorvastatin (LIPITOR) 40 MG tablet Take 40 mg by mouth at bedtime. 03/22/16  Yes [provider]  irbesartan (AVAPRO) 150 MG tablet Take 150 mg by mouth daily. 05/07/21  Yes [provider]  metFORMIN (GLUCOPHAGE) 1000 MG tablet Take 1,000 mg by mouth 2 (two) times daily with a meal. 06/21/21  Yes [provider]  RYBELSUS 7 MG TABS Take 7 mg by mouth every morning. 06/25/21  Yes [provider]    Physical Exam: Vitals:   07/06/21 2230 07/06/21 2245 07/06/21 2300 07/06/21 2330  BP: (!) 146/83 (!) 125/99 (!) 132/98 (!) 142/108  Pulse: (!) 102 (!) 101 99 (!) 102  Resp: 20 (!) '22 18 16  '$ Temp:      SpO2: 93% 92% 94% 99%  Weight:      Height:        Constitutional: NAD, calm, comfortable Eyes: PERRL, lids and conjunctivae Icteric ENMT: Mucous membranes are moist. Posterior pharynx clear of any exudate or lesions.Normal dentition.  Neck: normal, supple, no masses, no thyromegaly Respiratory: clear to auscultation bilaterally, no wheezing, no crackles. Normal respiratory effort. No accessory muscle use.  Cardiovascular: Regular rate and rhythm, no murmurs / rubs / gallops. No extremity edema. 2+ pedal pulses. No carotid bruits.  Abdomen: no tenderness, no masses palpated. No hepatosplenomegaly. Bowel sounds positive.  Musculoskeletal: no clubbing / cyanosis. No joint deformity upper and lower extremities. Good ROM, no contractures. Normal muscle tone.  Skin: no rashes, lesions, ulcers. No induration Neurologic: CN 2-12 grossly intact. Sensation intact, DTR normal. Strength 5/5 in all 4.  Psychiatric: Normal judgment and insight. Alert and oriented x 3. Normal mood.    Labs on Admission: I have personally reviewed following labs and imaging studies  CBC: Recent Labs  Lab 07/06/21 1925  WBC 5.2  NEUTROABS 3.8  HGB 14.7  HCT 43.1  MCV 96.0  PLT 90*   Basic Metabolic Panel: Recent Labs  Lab 07/06/21 1925  NA 131*  K 3.5  CL 102  CO2 22  GLUCOSE 173*  BUN 16  CREATININE 0.85  CALCIUM 8.5*   GFR: Estimated Creatinine Clearance: 82 mL/min (by C-G formula based on SCr of 0.85 mg/dL). Liver Function Tests: Recent Labs  Lab 07/06/21 1925  AST 126*  ALT 149*  ALKPHOS 229*  BILITOT 7.0*  PROT 7.3  ALBUMIN 3.1*   Recent Labs  Lab 07/06/21 1925  LIPASE 51   No results for input(Saunders): AMMONIA in the last 168 hours. Coagulation Profile: No  results for input(Saunders): INR, PROTIME in the last 168 hours. Cardiac Enzymes: No results for input(Saunders): CKTOTAL, CKMB, CKMBINDEX, TROPONINI in the last 168 hours. BNP (last 3 results) No results for input(Saunders): PROBNP in the last 8760 hours. HbA1C: Recent Labs    07/06/21 2324  HGBA1C 9.3*   CBG: No results for input(Saunders): GLUCAP in the last 168 hours. Lipid Profile: No results for input(Saunders): CHOL, HDL, LDLCALC, TRIG, CHOLHDL, LDLDIRECT in the last 72 hours. Thyroid Function Tests: No results for input(Saunders): TSH, T4TOTAL, FREET4, T3FREE, THYROIDAB in the last 72 hours. Anemia Panel: No results for input(Saunders): VITAMINB12, FOLATE, FERRITIN, TIBC, IRON, RETICCTPCT in the last 72 hours. Urine analysis:    Component Value Date/Time   COLORURINE AMBER (A) 07/06/2021 2000   APPEARANCEUR CLEAR 07/06/2021 2000   LABSPEC 1.039 (H) 07/06/2021 2000   PHURINE 5.0 07/06/2021 2000   GLUCOSEU NEGATIVE 07/06/2021 2000   HGBUR NEGATIVE 07/06/2021 2000   BILIRUBINUR SMALL (A) 07/06/2021 2000   KETONESUR NEGATIVE 07/06/2021 2000   PROTEINUR NEGATIVE 07/06/2021 2000   NITRITE NEGATIVE 07/06/2021 2000   LEUKOCYTESUR TRACE (A) 07/06/2021 2000    Radiological Exams on Admission: CT ABDOMEN PELVIS W CONTRAST  Result Date: 07/06/2021 CLINICAL DATA:  Jaundice EXAM: CT ABDOMEN AND PELVIS WITH CONTRAST TECHNIQUE: Multidetector CT imaging of the abdomen and pelvis was performed using the standard protocol following bolus administration of intravenous contrast. CONTRAST:  161m OMNIPAQUE IOHEXOL 350 MG/ML SOLN COMPARISON:  MRI 07/31/2017, CT 02/06/2012 FINDINGS: Lower chest: Lung bases demonstrate no acute consolidation or effusion. Borderline aneurysmal dilatation of aortic root, similar to 2012. Mild cardiomegaly. Hepatobiliary: Heterogeneous hyperenhancement within the right hepatic lobe. Moderate intra hepatic biliary dilatation. Poorly visible extrahepatic common bile duct, does not appear dilated at head of  pancreas. Lobulated liver morphology consistent with cirrhosis. Multiple calcified gallstones. Pancreas: Unremarkable. No pancreatic ductal dilatation or surrounding inflammatory changes. Spleen: Enlarged measuring 15.5 cm Adrenals/Urinary Tract: Adrenal glands are normal. Kidneys show no hydronephrosis. Subcentimeter hypodense lesions too small to further characterize. The bladder is unremarkable Stomach/Bowel: The stomach is nonenlarged. No dilated small bowel. No acute bowel wall thickening. Vascular/Lymphatic: Nonaneurysmal aorta. Multiple enlarged porta hepatis lymph nodes measuring up to 16 mm. Distal esophageal node measuring 10 mm. Numerous small tortuous vessels in the left upper quadrant. Reproductive: Prostate is unremarkable. Other: No free air. No significant ascites. Fat containing right inguinal hernia. Musculoskeletal: No acute osseous abnormality IMPRESSION: 1. Liver cirrhosis. Interim development of moderate intra hepatic biliary dilatation. Poorly visible extrahepatic common bile duct which does not appear dilated at head of pancreas, findings raise concern for hilar obstructing process though no gross mass seen on CT. There is heterogeneous hyperenhancement of right hepatic lobe indeterminate for mass or altered perfusion. Further evaluation with MRI/MRCP is recommended. 2. Mild porta hepatis adenopathy indeterminate for metastatic  disease. 3. Splenomegaly and findings suggestive of portal hypertension Electronically Signed   By: Donavan Foil M.D.   On: 07/06/2021 21:35    EKG: Independently reviewed.  Assessment/Plan Principal Problem:   Acquired dilation of bile duct Active Problems:   Benign hypertension   Cirrhosis of liver (HCC)   DM2 (diabetes mellitus, type 2) (HCC)   Hepatobiliary tract disease    Acquired dilation of bile duct / hepatobiliary tract dz - Concern for possible heptaobiliary tract CA of some form (cholangocarcinoma, pancreatic CA, HCC, etc) given LFTs and  indeterminate CT findings. EDP spoke with GI who will see in consult Getting MRCP w and w/o NPO for MRCP Cirrhosis of liver - Unclear cause, ? NASH? Hepatitis pnl pending Doesn't drink GI consult as above HTN - Cont ARB DM2 - Hold home meds Sensitive SSI Q4H  DVT prophylaxis: Lovenox Code Status: Full Family Communication: Wife at bedside Disposition Plan: Home after GI work up and clearance Consults called: GI called by EDP Admission status: Admit to inpatient  Severity of Illness: The appropriate patient status for this patient is INPATIENT. Inpatient status is judged to be reasonable and necessary in order to provide the required intensity of service to ensure the patient'Saunders safety. The patient'Saunders presenting symptoms, physical exam findings, and initial radiographic and laboratory data in the context of their chronic comorbidities is felt to place them at high risk for further clinical deterioration. Furthermore, it is not anticipated that the patient will be medically stable for discharge from the hospital within 2 midnights of admission. The following factors support the patient status of inpatient.   IP status for workup of cirrhosis, possible hepatobiliary CA, CBD obstruction.   * I certify that at the point of admission it is my clinical judgment that the patient will require inpatient hospital care spanning beyond 2 midnights from the point of admission due to high intensity of service, high risk for further deterioration and high frequency of surveillance required.*   Brytnee Bechler M. DO [facility]  How to contact the [facility] Attending or Consulting provider [place] or covering provider during after hours [place], for this patient?  Check the care team in [facility] and look for a) attending/consulting [facility] provider listed and Phillip) the [facility] team listed Log into [email or web address]  Amion Physician Scheduling and messaging for groups and whole hospitals  On call and physician  scheduling software for group practices, residents, hospitalists and other medical providers for call, clinic, rotation and shift schedules. On[employer] is a hospital-wide system for scheduling doctors and paging doctors on call. EasyPlot is for scientific plotting and data analysis.  [email or web address]  and use [place]'[facility] universal password to access. If you do not have the password, please contact the hospital operator.  Locate the [facility] provider you are looking for under [facility] and page to a number that you can be directly reached. If you still have difficulty reaching the provider, please page the [facility] (Director on Call) for the Hospitalists listed on amion for assistance.  07/07/2021, 12:34 AM

## 2021-07-07 NOTE — ED Notes (Signed)
Attempted to call report to 6N. RN unable to take report at this time.

## 2021-07-07 NOTE — Progress Notes (Signed)
Patient admitted to [phone]. Alert and oriented x4. Vital signs within normal limits. Denis c/o pain. Oriented to room and  remote.

## 2021-07-07 NOTE — Progress Notes (Signed)
PROGRESS NOTE    Phillip Saunders  [id number] DOB: 1951-05-29 DOA: 07/06/2021 PCP: Algis Greenhouse, MD   Brief Narrative:  70 year old with history of DM2, HTN admitted to the hospital for nausea and vomiting ongoing for 4 days prior to admission along with new onset of jaundice.  CT abdomen pelvis showed cirrhosis with moderate intrahepatic ductal dilation, questionable CBD obstruction right hepatic lobe mass, splenomegaly and portal hypertension.  GI team was consulted.   Assessment & Plan:   Principal Problem:   Acquired dilation of bile duct Active Problems:   Benign hypertension   Cirrhosis of liver (HCC)   DM2 (diabetes mellitus, type 2) (HCC)   Hepatobiliary tract disease  Ductal dilation/hepatobiliary tract obstruction Cirrhosis with jaundice - Obstructive pathology likely secondary to malignancy as seen on MRI.  Also unclear etiology about cirrhosis, denies any drinking. NASH vs autoimmune?  -Acute hepatitis panel-pending -MRCP-cirrhosis with concerns for infiltrative HCC, right portal vein tumor.  Concerns for metastases -AFP, CA 19-9, CEA -Ammonia 76.  Lactulose twice daily - GI following  Essential hypertension - On ARB  Diabetes mellitus type 2  -A1c 9.3.  Insulin sliding scale and Accu-Chek - Metformin 1000 mg twice daily on hold  Hyperlipidemia - Statin    DVT prophylaxis: enoxaparin (LOVENOX) injection 40 mg Start: 07/07/21 0045 Code Status: Full  Family Communication: Wife and daughter present at bedside  Status is: Inpatient  Remains inpatient appropriate because:Inpatient level of care appropriate due to severity of illness  Dispo: The patient is from: Home              Anticipated d/c is to: Home              Patient currently is not medically stable to d/c.  Ongoing evaluation for cryptogenic liver cirrhosis, hepatobiliary mass with obstructive pathology   Difficult to place patient No       Subjective: Patient tells me he has lost about  20 pounds in the last 2 months and for the past 3 days he has noted dark urine and yellowing of his eyes.  During this time he is also noted he has been more fatigued than usual.  Review of Systems Otherwise negative except as per HPI, including: General: Denies fever, chills, night sweats or unintended weight loss. Resp: Denies cough, wheezing, shortness of breath. Cardiac: Denies chest pain, palpitations, orthopnea, paroxysmal nocturnal dyspnea. GI: Denies abdominal pain, nausea, vomiting, diarrhea or constipation GU: Denies dysuria, frequency, hesitancy or incontinence MS: Denies muscle aches, joint pain or swelling Neuro: Denies headache, neurologic deficits (focal weakness, numbness, tingling), abnormal gait Psych: Denies anxiety, depression, SI/HI/AVH Skin: Denies new rashes or lesions ID: Denies sick contacts, exotic exposures, travel  Examination:  General exam: Appears calm and comfortable, scleral icterus Respiratory system: Clear to auscultation. Respiratory effort normal. Cardiovascular system: S1 & S2 heard, RRR. No JVD, murmurs, rubs, gallops or clicks. No pedal edema. Gastrointestinal system: Abdomen is nondistended, soft and nontender. No organomegaly or masses felt. Normal bowel sounds heard. Central nervous system: Alert and oriented. No focal neurological deficits. Extremities: Symmetric 5 x 5 power. Skin: No rashes, lesions or ulcers Psychiatry: Judgement and insight appear normal. Mood & affect appropriate.     Objective: Vitals:   07/07/21 0115 07/07/21 0147 07/07/21 0623 07/07/21 0809  BP: (!) 118/92 (!) 136/104 115/85 109/82  Pulse: (!) 107 (!) 105 (!) 103 (!) 101  Resp: (!) '25 18 18 17  '$ Temp:  98.2 F (36.8 C) 98.3 F (36.8 C)  98 F (36.7 C)  TempSrc:  Oral Oral Oral  SpO2: 92% 96% 93% 94%  Weight:      Height:        Intake/Output Summary (Last 24 hours) at 07/07/2021 0841 Last data filed at 07/07/2021 0616 Gross per 24 hour  Intake 1448.45 ml   Output --  Net 1448.45 ml   Filed Weights   07/06/21 1857  Weight: 88.9 kg     Data Reviewed:   CBC: Recent Labs  Lab 07/06/21 1925 07/07/21 0205  WBC 5.2 4.5  NEUTROABS 3.8  --   HGB 14.7 14.4  HCT 43.1 41.9  MCV 96.0 94.6  PLT 90* 75*   Basic Metabolic Panel: Recent Labs  Lab 07/06/21 1925 07/07/21 0205  NA 131* 132*  K 3.5 3.4*  CL 102 102  CO2 22 21*  GLUCOSE 173* 245*  BUN 16 12  CREATININE 0.85 0.82  CALCIUM 8.5* 8.2*   GFR: Estimated Creatinine Clearance: 85 mL/min (by C-G formula based on SCr of 0.82 mg/dL). Liver Function Tests: Recent Labs  Lab 07/06/21 1925 07/07/21 0205  AST 126* 125*  ALT 149* 143*  ALKPHOS 229* 237*  BILITOT 7.0* 7.9*  PROT 7.3 6.7  ALBUMIN 3.1* 2.8*   Recent Labs  Lab 07/06/21 1925  LIPASE 51   Recent Labs  Lab 07/06/21 2329  AMMONIA 76*   Coagulation Profile: Recent Labs  Lab 07/06/21 2329  INR 1.1   Cardiac Enzymes: No results for input(s): CKTOTAL, CKMB, CKMBINDEX, TROPONINI in the last 168 hours. BNP (last 3 results) No results for input(s): PROBNP in the last 8760 hours. HbA1C: Recent Labs    07/06/21 2324  HGBA1C 9.3*   CBG: Recent Labs  Lab 07/07/21 0214 07/07/21 0618  GLUCAP 223* 144*   Lipid Profile: No results for input(s): CHOL, HDL, LDLCALC, TRIG, CHOLHDL, LDLDIRECT in the last 72 hours. Thyroid Function Tests: No results for input(s): TSH, T4TOTAL, FREET4, T3FREE, THYROIDAB in the last 72 hours. Anemia Panel: No results for input(s): VITAMINB12, FOLATE, FERRITIN, TIBC, IRON, RETICCTPCT in the last 72 hours. Sepsis Labs: No results for input(s): PROCALCITON, LATICACIDVEN in the last 168 hours.  Recent Results (from the past 240 hour(s))  Resp Panel by RT-PCR (Flu A&B, Covid) Nasopharyngeal Swab     Status: None   Collection Time: 07/06/21  7:15 PM   Specimen: Nasopharyngeal Swab; Nasopharyngeal(NP) swabs in vial transport medium  Result Value Ref Range Status   SARS  Coronavirus 2 by RT PCR NEGATIVE NEGATIVE Final    Comment: (NOTE) SARS-CoV-2 target nucleic acids are NOT DETECTED.  The SARS-CoV-2 RNA is generally detectable in upper respiratory specimens during the acute phase of infection. The lowest concentration of SARS-CoV-2 viral copies this assay can detect is 138 copies/mL. A negative result does not preclude SARS-Cov-2 infection and should not be used as the sole basis for treatment or other patient management decisions. A negative result may occur with  improper specimen collection/handling, submission of specimen other than nasopharyngeal swab, presence of viral mutation(s) within the areas targeted by this assay, and inadequate number of viral copies(<138 copies/mL). A negative result must be combined with clinical observations, patient history, and epidemiological information. The expected result is Negative.  Fact Sheet for Patients:  EntrepreneurPulse.com.au  Fact Sheet for Healthcare Providers:  IncredibleEmployment.be  This test is no t yet approved or cleared by the [place] FDA and  has been authorized for detection and/or diagnosis of SARS-CoV-2 by FDA under an Emergency Use  Authorization (EUA). This EUA will remain  in effect (meaning this test can be used) for the duration of the COVID-19 declaration under Section 564(b)(1) of the Act, 21 U.S.C.section 360bbb-3(b)(1), unless the authorization is terminated  or revoked sooner.       Influenza A by PCR NEGATIVE NEGATIVE Final   Influenza B by PCR NEGATIVE NEGATIVE Final    Comment: (NOTE) The Xpert Xpress SARS-CoV-2/FLU/RSV plus assay is intended as an aid in the diagnosis of influenza from Nasopharyngeal swab specimens and should not be used as a sole basis for treatment. Nasal washings and aspirates are unacceptable for Xpert Xpress SARS-CoV-2/FLU/RSV testing.  Fact Sheet for  Patients: EntrepreneurPulse.com.au  Fact Sheet for Healthcare Providers: IncredibleEmployment.be  This test is not yet approved or cleared by the [place] FDA and has been authorized for detection and/or diagnosis of SARS-CoV-2 by FDA under an Emergency Use Authorization (EUA). This EUA will remain in effect (meaning this test can be used) for the duration of the COVID-19 declaration under Section 564(b)(1) of the Act, 21 U.S.C. section 360bbb-3(b)(1), unless the authorization is terminated or revoked.  Performed at [facility], [place] [address] [place], [place] [address]          Radiology Studies: CT ABDOMEN PELVIS W CONTRAST  Result Date: 07/06/2021 CLINICAL DATA:  Jaundice EXAM: CT ABDOMEN AND PELVIS WITH CONTRAST TECHNIQUE: Multidetector CT imaging of the abdomen and pelvis was performed using the standard protocol following bolus administration of intravenous contrast. CONTRAST:  173m OMNIPAQUE IOHEXOL 350 MG/ML SOLN COMPARISON:  MRI 07/31/2017, CT 02/06/2012 FINDINGS: Lower chest: Lung bases demonstrate no acute consolidation or effusion. Borderline aneurysmal dilatation of aortic root, similar to 2012. Mild cardiomegaly. Hepatobiliary: Heterogeneous hyperenhancement within the right hepatic lobe. Moderate intra hepatic biliary dilatation. Poorly visible extrahepatic common bile duct, does not appear dilated at head of pancreas. Lobulated liver morphology consistent with cirrhosis. Multiple calcified gallstones. Pancreas: Unremarkable. No pancreatic ductal dilatation or surrounding inflammatory changes. Spleen: Enlarged measuring 15.5 cm Adrenals/Urinary Tract: Adrenal glands are normal. Kidneys show no hydronephrosis. Subcentimeter hypodense lesions too small to further characterize. The bladder is unremarkable Stomach/Bowel: The stomach is nonenlarged. No dilated small bowel. No acute bowel wall thickening. Vascular/Lymphatic:  Nonaneurysmal aorta. Multiple enlarged porta hepatis lymph nodes measuring up to 16 mm. Distal esophageal node measuring 10 mm. Numerous small tortuous vessels in the left upper quadrant. Reproductive: Prostate is unremarkable. Other: No free air. No significant ascites. Fat containing right inguinal hernia. Musculoskeletal: No acute osseous abnormality IMPRESSION: 1. Liver cirrhosis. Interim development of moderate intra hepatic biliary dilatation. Poorly visible extrahepatic common bile duct which does not appear dilated at head of pancreas, findings raise concern for hilar obstructing process though no gross mass seen on CT. There is heterogeneous hyperenhancement of right hepatic lobe indeterminate for mass or altered perfusion. Further evaluation with MRI/MRCP is recommended. 2. Mild porta hepatis adenopathy indeterminate for metastatic disease. 3. Splenomegaly and findings suggestive of portal hypertension Electronically Signed   By: KDonavan FoilM.D.   On: 07/06/2021 21:35   MR ABDOMEN MRCP W WO CONTAST  Result Date: 07/07/2021 CLINICAL DATA:  Recent jaundice. CT demonstrating cirrhosis and possible primary malignancy. EXAM: MRI ABDOMEN WITHOUT AND WITH CONTRAST (INCLUDING MRCP) TECHNIQUE: Multiplanar multisequence MR imaging of the abdomen was performed both before and after the administration of intravenous contrast. Heavily T2-weighted images of the biliary and pancreatic ducts were obtained, and three-dimensional MRCP images were rendered by post processing. CONTRAST:  923mGADAVIST GADOBUTROL  1 MMOL/ML IV SOLN COMPARISON:  CT of 1 day prior FINDINGS: Moderately motion degraded exam. The extent of motion makes LI-RADS characterisation impossible. Lower chest: Mild cardiomegaly, without pericardial or pleural effusion. Hepatobiliary: Marked cirrhosis, with caudate lobe enlargement and irregular hepatic capsule. Centered in segment 8 is infiltrative relatively diffuse T2 hyperintensity on 09/05,  corresponding to restricted diffusion on 43/6 and heterogeneous arterial phase hyperenhancement including on 40/1101. Example at on the order of 10.1 x 8.7 cm. The T2 signal and restricted diffusion extend towards the right portal vein, which is not opacified on post-contrast images, and likely involved with tumor. Arterial phase images demonstrate multiple other areas of subcentimeter hyperenhancement, not confidently identified on later post-contrast imaging. Indeterminate. Moderate intrahepatic biliary duct dilatation within the left hepatic lobe and posterior right hepatic lobe, followed to the presumed central right hepatic lobe tumor. Example obstruction on 50 through 53 of 1,103. Small gallstones without specific evidence of acute cholecystitis. No common duct dilatation. Pancreas:  Normal, without mass or ductal dilatation. Spleen:  Splenomegaly at 15.5 cm craniocaudal. Adrenals/Urinary Tract: Normal adrenal glands. Bilateral small renal cysts. No hydronephrosis. Stomach/Bowel: Normal stomach and abdominal bowel loops. Vascular/Lymphatic: Aortic atherosclerosis. The main portal vein and splenic veins are patent. Extensive portosystemic collaterals. 1.2 cm periesophageal node at the level of the diaphragmatic hiatus on 11/05. Gastrohepatic ligament node measures 1.5 cm on 77/1103. Other:  No ascites. Musculoskeletal: No acute osseous abnormality. Probable hemangiomas within the midthoracic and mid lumbar spine. IMPRESSION: 1. Moderately motion degraded exam, with limitations above. 2. 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 As above, the extent of motion degradation makes LI-Rads categorization impossible. 3. Upper abdominal adenopathy which could be metastatic or reactive. 4. Portal venous hypertension and splenomegaly. 5. Cholelithiasis  Electronically Signed   By: Abigail Miyamoto M.D.   On: 07/07/2021 06:25        Scheduled Meds:  atorvastatin  40 mg Oral QHS   enoxaparin (LOVENOX) injection  40 mg Subcutaneous QHS   insulin aspart  0-9 Units Subcutaneous Q4H   irbesartan  150 mg Oral Daily   Continuous Infusions:  lactated ringers Stopped (07/07/21 0421)     LOS: 0 days   Time spent= 35 mins    Phillip Wadding Arsenio Loader, MD [facility]  If 7PM-7AM, please contact night-coverage  07/07/2021, 8:41 AM

## 2021-07-08 DIAGNOSIS — R17 Unspecified jaundice: Secondary | ICD-10-CM

## 2021-07-08 DIAGNOSIS — R160 Hepatomegaly, not elsewhere classified: Secondary | ICD-10-CM

## 2021-07-08 LAB — COMPREHENSIVE METABOLIC PANEL
ALT: 125 U/L — ABNORMAL HIGH (ref 0–44)
AST: 103 U/L — ABNORMAL HIGH (ref 15–41)
Albumin: 2.6 g/dL — ABNORMAL LOW (ref 3.5–5.0)
Alkaline Phosphatase: 233 U/L — ABNORMAL HIGH (ref 38–126)
Anion gap: 5 (ref 5–15)
BUN: 12 mg/dL (ref 8–23)
CO2: 27 mmol/L (ref 22–32)
Calcium: 8.2 mg/dL — ABNORMAL LOW (ref 8.9–10.3)
Chloride: 103 mmol/L (ref 98–111)
Creatinine, Ser: 0.83 mg/dL (ref 0.61–1.24)
GFR, Estimated: >60 mL/min (ref >60)
Glucose, Bld: 186 mg/dL — ABNORMAL HIGH (ref 70–99)
Potassium: 3.5 mmol/L (ref 3.5–5.1)
Sodium: 135 mmol/L (ref 135–145)
Total Bilirubin: 5.5 mg/dL — ABNORMAL HIGH (ref 0.3–1.2)
Total Protein: 6.5 g/dL (ref 6.5–8.1)

## 2021-07-08 LAB — CEA: CEA: 8.1 ng/mL — ABNORMAL HIGH (ref 0.0–4.7)

## 2021-07-08 LAB — RETICULOCYTES
Immature Retic Fract: 7.5 % (ref 2.3–15.9)
RBC.: 4.23 MIL/uL (ref 4.22–5.81)
Retic Count, Absolute: 84.6 10*3/uL (ref 19.0–186.0)
Retic Ct Pct: 2 % (ref 0.4–3.1)

## 2021-07-08 LAB — CBC
HCT: 41.5 % (ref 39.0–52.0)
Hemoglobin: 13.9 g/dL (ref 13.0–17.0)
MCH: 32 pg (ref 26.0–34.0)
MCHC: 33.5 g/dL (ref 30.0–36.0)
MCV: 95.4 fL (ref 80.0–100.0)
Platelets: 72 10*3/uL — ABNORMAL LOW (ref 150–400)
RBC: 4.35 MIL/uL (ref 4.22–5.81)
RDW: 13.9 % (ref 11.5–15.5)
WBC: 3.6 10*3/uL — ABNORMAL LOW (ref 4.0–10.5)
nRBC: 0 % (ref 0.0–0.2)

## 2021-07-08 LAB — IRON AND TIBC
Iron: 122 ug/dL (ref 45–182)
Saturation Ratios: 46 % — ABNORMAL HIGH (ref 17.9–39.5)
TIBC: 267 ug/dL (ref 250–450)
UIBC: 145 ug/dL

## 2021-07-08 LAB — PROTIME-INR
INR: 1 (ref 0.8–1.2)
Prothrombin Time: 13.6 seconds (ref 11.4–15.2)

## 2021-07-08 LAB — FERRITIN: Ferritin: 683 ng/mL — ABNORMAL HIGH (ref 24–336)

## 2021-07-08 LAB — IGG: IgG (Immunoglobin G), Serum: 1309 mg/dL (ref 603–1613)

## 2021-07-08 LAB — HEPATITIS B SURFACE ANTIBODY,QUALITATIVE: Hep B S Ab: NONREACTIVE

## 2021-07-08 LAB — GLUCOSE, CAPILLARY
Glucose-Capillary: 114 mg/dL — ABNORMAL HIGH (ref 70–99)
Glucose-Capillary: 178 mg/dL — ABNORMAL HIGH (ref 70–99)
Glucose-Capillary: 191 mg/dL — ABNORMAL HIGH (ref 70–99)
Glucose-Capillary: 211 mg/dL — ABNORMAL HIGH (ref 70–99)
Glucose-Capillary: 214 mg/dL — ABNORMAL HIGH (ref 70–99)
Glucose-Capillary: 222 mg/dL — ABNORMAL HIGH (ref 70–99)
Glucose-Capillary: 234 mg/dL — ABNORMAL HIGH (ref 70–99)

## 2021-07-08 LAB — CERULOPLASMIN: Ceruloplasmin: 26.7 mg/dL (ref 16.0–31.0)

## 2021-07-08 LAB — ALPHA-1-ANTITRYPSIN: A-1 Antitrypsin, Ser: 199 mg/dL — ABNORMAL HIGH (ref 101–187)

## 2021-07-08 LAB — CANCER ANTIGEN 19-9: CA 19-9: 417 U/mL — ABNORMAL HIGH (ref 0–35)

## 2021-07-08 LAB — AFP TUMOR MARKER: AFP, Serum, Tumor Marker: 5.9 ng/mL (ref 0.0–8.4)

## 2021-07-08 LAB — MAGNESIUM: Magnesium: 2 mg/dL (ref 1.7–2.4)

## 2021-07-08 NOTE — Progress Notes (Signed)
PROGRESS NOTE    Phillip Saunders  [id number] DOB: 06/08/1951 DOA: 07/06/2021 PCP: Algis Greenhouse, MD   Brief Narrative:  70 year old with history of DM2, HTN admitted to the hospital for nausea and vomiting ongoing for 4 days prior to admission along with new onset of jaundice.  CT abdomen pelvis showed cirrhosis with moderate intrahepatic ductal dilation, questionable CBD obstruction right hepatic lobe mass, splenomegaly and portal hypertension.  GI team was consulted.  Tumor markers have been sent.   Assessment & Plan:   Principal Problem:   Acquired dilation of bile duct Active Problems:   Benign hypertension   Cirrhosis of liver (HCC)   DM2 (diabetes mellitus, type 2) (HCC)   Hepatobiliary tract disease  Ductal dilation/hepatobiliary tract obstruction Cirrhosis with jaundice - Obstructive pathology likely secondary to malignancy as seen on MRI.  Also unclear etiology about cirrhosis, denies any drinking. NASH vs autoimmune?  -Acute hepatitis panel-negative -MRCP-cirrhosis with concerns for infiltrative HCC, right portal vein tumor.  Concerns for metastases -AFP, CA 19-9, CEA.  Endoscopic evaluation per GI team -Ammonia 76.  Lactulose now stopped  Essential hypertension - On ARB  Diabetes mellitus type 2  -A1c 9.3.  Insulin sliding scale and Accu-Chek - Metformin 1000 mg twice daily on hold  Hyperlipidemia - Statin    DVT prophylaxis: enoxaparin (LOVENOX) injection 40 mg Start: 07/07/21 0045 Code Status: Full  Family Communication: Wife at bedside  Status is: Inpatient  Remains inpatient appropriate because:Inpatient level of care appropriate due to severity of illness  Dispo: The patient is from: Home              Anticipated d/c is to: Home              Patient currently is not medically stable to d/c.  Discharge home once cleared by GI   Difficult to place patient No       Subjective: Patient is wanting to go home this morning but he understands that  he needs to stay in the hospital until cleared by GI. No acute events overnight, tells me he feels little better this morning Review of Systems Otherwise negative except as per HPI, including: General: Denies fever, chills, night sweats or unintended weight loss. Resp: Denies cough, wheezing, shortness of breath. Cardiac: Denies chest pain, palpitations, orthopnea, paroxysmal nocturnal dyspnea. GI: Denies abdominal pain, nausea, vomiting, diarrhea or constipation GU: Denies dysuria, frequency, hesitancy or incontinence MS: Denies muscle aches, joint pain or swelling Neuro: Denies headache, neurologic deficits (focal weakness, numbness, tingling), abnormal gait Psych: Denies anxiety, depression, SI/HI/AVH Skin: Denies new rashes or lesions ID: Denies sick contacts, exotic exposures, travel  Examination:  Constitutional: Not in acute distress Respiratory: Clear to auscultation bilaterally Cardiovascular: Normal sinus rhythm, no rubs Abdomen: Nontender nondistended good bowel sounds Musculoskeletal: No edema noted Skin: No rashes seen Neurologic: CN 2-12 grossly intact.  And nonfocal Psychiatric: Normal judgment and insight. Alert and oriented x 3. Normal mood.   Objective: Vitals:   07/07/21 2018 07/08/21 0007 07/08/21 0359 07/08/21 0746  BP: 108/80 110/82 109/81 121/86  Pulse: 63 65 65 64  Resp: '17 17 18 18  '$ Temp: 98.3 F (36.8 C) 98.2 F (36.8 C) 98.1 F (36.7 C) (!) 97.5 F (36.4 C)  TempSrc: Oral Oral Oral Oral  SpO2: 97% 94% 96% 98%  Weight:      Height:        Intake/Output Summary (Last 24 hours) at 07/08/2021 1242 Last data filed at 07/08/2021 0956 Gross per  24 hour  Intake 2715.48 ml  Output --  Net 2715.48 ml   Filed Weights   07/06/21 1857  Weight: 88.9 kg     Data Reviewed:   CBC: Recent Labs  Lab 07/06/21 1925 07/07/21 0205 07/08/21 0117  WBC 5.2 4.5 3.6*  NEUTROABS 3.8  --   --   HGB 14.7 14.4 13.9  HCT 43.1 41.9 41.5  MCV 96.0 94.6 95.4   PLT 90* 75* 72*   Basic Metabolic Panel: Recent Labs  Lab 07/06/21 1925 07/07/21 0205 07/08/21 0117  NA 131* 132* 135  K 3.5 3.4* 3.5  CL 102 102 103  CO2 22 21* 27  GLUCOSE 173* 245* 186*  BUN '16 12 12  '$ CREATININE 0.85 0.82 0.83  CALCIUM 8.5* 8.2* 8.2*  MG  --   --  2.0   GFR: Estimated Creatinine Clearance: 84 mL/min (by C-G formula based on SCr of 0.83 mg/dL). Liver Function Tests: Recent Labs  Lab 07/06/21 1925 07/07/21 0205 07/07/21 1109 07/08/21 0117  AST 126* 125*  --  103*  ALT 149* 143*  --  125*  ALKPHOS 229* 237*  --  233*  BILITOT 7.0* 7.9* 7.0* 5.5*  PROT 7.3 6.7  --  6.5  ALBUMIN 3.1* 2.8*  --  2.6*   Recent Labs  Lab 07/06/21 1925  LIPASE 51   Recent Labs  Lab 07/06/21 2329  AMMONIA 76*   Coagulation Profile: Recent Labs  Lab 07/06/21 2329 07/08/21 0117  INR 1.1 1.0   Cardiac Enzymes: No results for input(s): CKTOTAL, CKMB, CKMBINDEX, TROPONINI in the last 168 hours. BNP (last 3 results) No results for input(s): PROBNP in the last 8760 hours. HbA1C: Recent Labs    07/06/21 2324  HGBA1C 9.3*   CBG: Recent Labs  Lab 07/07/21 2016 07/08/21 0005 07/08/21 0356 07/08/21 0747 07/08/21 1149  GLUCAP 175* 178* 191* 214* 234*   Lipid Profile: No results for input(s): CHOL, HDL, LDLCALC, TRIG, CHOLHDL, LDLDIRECT in the last 72 hours. Thyroid Function Tests: No results for input(s): TSH, T4TOTAL, FREET4, T3FREE, THYROIDAB in the last 72 hours. Anemia Panel: Recent Labs    07/07/21 1106 07/08/21 0117  FERRITIN 764* 683*  TIBC  --  267  IRON  --  122  RETICCTPCT  --  2.0   Sepsis Labs: No results for input(s): PROCALCITON, LATICACIDVEN in the last 168 hours.  Recent Results (from the past 240 hour(s))  Resp Panel by RT-PCR (Flu A&B, Covid) Nasopharyngeal Swab     Status: None   Collection Time: 07/06/21  7:15 PM   Specimen: Nasopharyngeal Swab; Nasopharyngeal(NP) swabs in vial transport medium  Result Value Ref Range Status    SARS Coronavirus 2 by RT PCR NEGATIVE NEGATIVE Final    Comment: (NOTE) SARS-CoV-2 target nucleic acids are NOT DETECTED.  The SARS-CoV-2 RNA is generally detectable in upper respiratory specimens during the acute phase of infection. The lowest concentration of SARS-CoV-2 viral copies this assay can detect is 138 copies/mL. A negative result does not preclude SARS-Cov-2 infection and should not be used as the sole basis for treatment or other patient management decisions. A negative result may occur with  improper specimen collection/handling, submission of specimen other than nasopharyngeal swab, presence of viral mutation(s) within the areas targeted by this assay, and inadequate number of viral copies(<138 copies/mL). A negative result must be combined with clinical observations, patient history, and epidemiological information. The expected result is Negative.  Fact Sheet for Patients:  EntrepreneurPulse.com.au  Fact  Sheet for Healthcare Providers:  IncredibleEmployment.be  This test is no t yet approved or cleared by the [place] FDA and  has been authorized for detection and/or diagnosis of SARS-CoV-2 by FDA under an Emergency Use Authorization (EUA). This EUA will remain  in effect (meaning this test can be used) for the duration of the COVID-19 declaration under Section 564(b)(1) of the Act, 21 U.S.C.section 360bbb-3(b)(1), unless the authorization is terminated  or revoked sooner.       Influenza A by PCR NEGATIVE NEGATIVE Final   Influenza B by PCR NEGATIVE NEGATIVE Final    Comment: (NOTE) The Xpert Xpress SARS-CoV-2/FLU/RSV plus assay is intended as an aid in the diagnosis of influenza from Nasopharyngeal swab specimens and should not be used as a sole basis for treatment. Nasal washings and aspirates are unacceptable for Xpert Xpress SARS-CoV-2/FLU/RSV testing.  Fact Sheet for  Patients: EntrepreneurPulse.com.au  Fact Sheet for Healthcare Providers: IncredibleEmployment.be  This test is not yet approved or cleared by the [place] FDA and has been authorized for detection and/or diagnosis of SARS-CoV-2 by FDA under an Emergency Use Authorization (EUA). This EUA will remain in effect (meaning this test can be used) for the duration of the COVID-19 declaration under Section 564(b)(1) of the Act, 21 U.S.C. section 360bbb-3(b)(1), unless the authorization is terminated or revoked.  Performed at [facility], [place] [address] [place], [place] [address]          Radiology Studies: CT ABDOMEN PELVIS W CONTRAST  Result Date: 07/06/2021 CLINICAL DATA:  Jaundice EXAM: CT ABDOMEN AND PELVIS WITH CONTRAST TECHNIQUE: Multidetector CT imaging of the abdomen and pelvis was performed using the standard protocol following bolus administration of intravenous contrast. CONTRAST:  149m OMNIPAQUE IOHEXOL 350 MG/ML SOLN COMPARISON:  MRI 07/31/2017, CT 02/06/2012 FINDINGS: Lower chest: Lung bases demonstrate no acute consolidation or effusion. Borderline aneurysmal dilatation of aortic root, similar to 2012. Mild cardiomegaly. Hepatobiliary: Heterogeneous hyperenhancement within the right hepatic lobe. Moderate intra hepatic biliary dilatation. Poorly visible extrahepatic common bile duct, does not appear dilated at head of pancreas. Lobulated liver morphology consistent with cirrhosis. Multiple calcified gallstones. Pancreas: Unremarkable. No pancreatic ductal dilatation or surrounding inflammatory changes. Spleen: Enlarged measuring 15.5 cm Adrenals/Urinary Tract: Adrenal glands are normal. Kidneys show no hydronephrosis. Subcentimeter hypodense lesions too small to further characterize. The bladder is unremarkable Stomach/Bowel: The stomach is nonenlarged. No dilated small bowel. No acute bowel wall thickening. Vascular/Lymphatic:  Nonaneurysmal aorta. Multiple enlarged porta hepatis lymph nodes measuring up to 16 mm. Distal esophageal node measuring 10 mm. Numerous small tortuous vessels in the left upper quadrant. Reproductive: Prostate is unremarkable. Other: No free air. No significant ascites. Fat containing right inguinal hernia. Musculoskeletal: No acute osseous abnormality IMPRESSION: 1. Liver cirrhosis. Interim development of moderate intra hepatic biliary dilatation. Poorly visible extrahepatic common bile duct which does not appear dilated at head of pancreas, findings raise concern for hilar obstructing process though no gross mass seen on CT. There is heterogeneous hyperenhancement of right hepatic lobe indeterminate for mass or altered perfusion. Further evaluation with MRI/MRCP is recommended. 2. Mild porta hepatis adenopathy indeterminate for metastatic disease. 3. Splenomegaly and findings suggestive of portal hypertension Electronically Signed   By: KDonavan FoilM.D.   On: 07/06/2021 21:35   MR ABDOMEN MRCP W WO CONTAST  Result Date: 07/07/2021 CLINICAL DATA:  Recent jaundice. CT demonstrating cirrhosis and possible primary malignancy. EXAM: MRI ABDOMEN WITHOUT AND WITH CONTRAST (INCLUDING MRCP) TECHNIQUE: Multiplanar multisequence MR imaging of  the abdomen was performed both before and after the administration of intravenous contrast. Heavily T2-weighted images of the biliary and pancreatic ducts were obtained, and three-dimensional MRCP images were rendered by post processing. CONTRAST:  50m GADAVIST GADOBUTROL 1 MMOL/ML IV SOLN COMPARISON:  CT of 1 day prior FINDINGS: Moderately motion degraded exam. The extent of motion makes LI-RADS characterisation impossible. Lower chest: Mild cardiomegaly, without pericardial or pleural effusion. Hepatobiliary: Marked cirrhosis, with caudate lobe enlargement and irregular hepatic capsule. Centered in segment 8 is infiltrative relatively diffuse T2 hyperintensity on 09/05,  corresponding to restricted diffusion on 43/6 and heterogeneous arterial phase hyperenhancement including on 40/1101. Example at on the order of 10.1 x 8.7 cm. The T2 signal and restricted diffusion extend towards the right portal vein, which is not opacified on post-contrast images, and likely involved with tumor. Arterial phase images demonstrate multiple other areas of subcentimeter hyperenhancement, not confidently identified on later post-contrast imaging. Indeterminate. Moderate intrahepatic biliary duct dilatation within the left hepatic lobe and posterior right hepatic lobe, followed to the presumed central right hepatic lobe tumor. Example obstruction on 50 through 53 of 1,103. Small gallstones without specific evidence of acute cholecystitis. No common duct dilatation. Pancreas:  Normal, without mass or ductal dilatation. Spleen:  Splenomegaly at 15.5 cm craniocaudal. Adrenals/Urinary Tract: Normal adrenal glands. Bilateral small renal cysts. No hydronephrosis. Stomach/Bowel: Normal stomach and abdominal bowel loops. Vascular/Lymphatic: Aortic atherosclerosis. The main portal vein and splenic veins are patent. Extensive portosystemic collaterals. 1.2 cm periesophageal node at the level of the diaphragmatic hiatus on 11/05. Gastrohepatic ligament node measures 1.5 cm on 77/1103. Other:  No ascites. Musculoskeletal: No acute osseous abnormality. Probable hemangiomas within the midthoracic and mid lumbar spine. IMPRESSION: 1. Moderately motion degraded exam, with limitations above. 2. 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 As above, the extent of motion degradation makes LI-Rads categorization impossible. 3. Upper abdominal adenopathy which could be metastatic or reactive. 4. Portal venous hypertension and splenomegaly. 5. Cholelithiasis  Electronically Signed   By: KAbigail MiyamotoM.D.   On: 07/07/2021 06:25        Scheduled Meds:  atorvastatin  40 mg Oral QHS   enoxaparin (LOVENOX) injection  40 mg Subcutaneous QHS   insulin aspart  0-15 Units Subcutaneous TID WC   insulin aspart  0-5 Units Subcutaneous QHS   insulin aspart  2 Units Subcutaneous TID WC   irbesartan  150 mg Oral Daily   lactulose  20 g Oral BID   Continuous Infusions:     LOS: 1 day   Time spent= 35 mins    John Vasconcelos CArsenio Loader MD [facility]  If 7PM-7AM, please contact night-coverage  07/08/2021, 12:42 PM

## 2021-07-08 NOTE — Progress Notes (Addendum)
Daily Rounding Note  07/08/2021, 8:17 AM  LOS: 1 day   SUBJECTIVE:   Chief complaint:  new dx cirrhosis and liver mass  Patient is in the shower so I spoke with his wife in the room.  She said his appetite is better he ate the bulk of his dinner and breakfast.  No nausea, vomiting, significant abdominal pain.  No confusion, no somnolence. Getting antsy being in the hospital.  Wife says he is somebody who who is always outside doing something, does not spend time watching TV. Vital signs are stable.  No fever.   OBJECTIVE:         Vital signs in last 24 hours:    Temp:  [97.5 F (36.4 C)-98.3 F (36.8 C)] 97.5 F (36.4 C) (08/21 0746) Pulse Rate:  [63-95] 64 (08/21 0746) Resp:  [17-18] 18 (08/21 0746) BP: (106-121)/(80-86) 121/86 (08/21 0746) SpO2:  [94 %-98 %] 98 % (08/21 0746) Last BM Date: 07/08/21 Filed Weights   07/06/21 1857  Weight: 88.9 kg   Patient was not examined as he was in the bathroom taking a shower.  Spoke with his wife.  Intake/Output from previous day: 08/20 0701 - 08/21 0700 In: 2124.6 [P.O.:660; I.V.:1464.6] Out: -   Intake/Output this shift: No intake/output data recorded.  Lab Results: Recent Labs    07/06/21 1925 07/07/21 0205 07/08/21 0117  WBC 5.2 4.5 3.6*  HGB 14.7 14.4 13.9  HCT 43.1 41.9 41.5  PLT 90* 75* 72*   BMET Recent Labs    07/06/21 1925 07/07/21 0205 07/08/21 0117  NA 131* 132* 135  K 3.5 3.4* 3.5  CL 102 102 103  CO2 22 21* 27  GLUCOSE 173* 245* 186*  BUN 16 12 12   CREATININE 0.85 0.82 0.83  CALCIUM 8.5* 8.2* 8.2*   LFT Recent Labs    07/06/21 1925 07/07/21 0205 07/07/21 1109 07/08/21 0117  PROT 7.3 6.7  --  6.5  ALBUMIN 3.1* 2.8*  --  2.6*  AST 126* 125*  --  103*  ALT 149* 143*  --  125*  ALKPHOS 229* 237*  --  233*  BILITOT 7.0* 7.9* 7.0* 5.5*  BILIDIR  --   --  3.8*  --   IBILI  --   --  3.2*  --    PT/INR Recent Labs     07/06/21 2329 07/08/21 0117  LABPROT 14.4 13.6  INR 1.1 1.0   Hepatitis Panel Recent Labs    07/06/21 1925  HEPBSAG NON REACTIVE  HCVAB NON REACTIVE  HEPAIGM NON REACTIVE  HEPBIGM NON REACTIVE    Studies/Results: CT ABDOMEN PELVIS W CONTRAST  Result Date: 07/06/2021 CLINICAL DATA:  Jaundice EXAM: CT ABDOMEN AND PELVIS WITH CONTRAST TECHNIQUE: Multidetector CT imaging of the abdomen and pelvis was performed using the standard protocol following bolus administration of intravenous contrast. CONTRAST:  166m OMNIPAQUE IOHEXOL 350 MG/ML SOLN COMPARISON:  MRI 07/31/2017, CT 02/06/2012 FINDINGS: Lower chest: Lung bases demonstrate no acute consolidation or effusion. Borderline aneurysmal dilatation of aortic root, similar to 2012. Mild cardiomegaly. Hepatobiliary: Heterogeneous hyperenhancement within the right hepatic lobe. Moderate intra hepatic biliary dilatation. Poorly visible extrahepatic common bile duct, does not appear dilated at head of pancreas. Lobulated liver morphology consistent with cirrhosis. Multiple calcified gallstones. Pancreas: Unremarkable. No pancreatic ductal dilatation or surrounding inflammatory changes. Spleen: Enlarged measuring 15.5 cm Adrenals/Urinary Tract: Adrenal glands are normal. Kidneys show no hydronephrosis. Subcentimeter hypodense lesions too small to further  characterize. The bladder is unremarkable Stomach/Bowel: The stomach is nonenlarged. No dilated small bowel. No acute bowel wall thickening. Vascular/Lymphatic: Nonaneurysmal aorta. Multiple enlarged porta hepatis lymph nodes measuring up to 16 mm. Distal esophageal node measuring 10 mm. Numerous small tortuous vessels in the left upper quadrant. Reproductive: Prostate is unremarkable. Other: No free air. No significant ascites. Fat containing right inguinal hernia. Musculoskeletal: No acute osseous abnormality IMPRESSION: 1. Liver cirrhosis. Interim development of moderate intra hepatic biliary dilatation.  Poorly visible extrahepatic common bile duct which does not appear dilated at head of pancreas, findings raise concern for hilar obstructing process though no gross mass seen on CT. There is heterogeneous hyperenhancement of right hepatic lobe indeterminate for mass or altered perfusion. Further evaluation with MRI/MRCP is recommended. 2. Mild porta hepatis adenopathy indeterminate for metastatic disease. 3. Splenomegaly and findings suggestive of portal hypertension Electronically Signed   By: Donavan Foil M.D.   On: 07/06/2021 21:35   MR ABDOMEN MRCP W WO CONTAST  Result Date: 07/07/2021 CLINICAL DATA:  Recent jaundice. CT demonstrating cirrhosis and possible primary malignancy. EXAM: MRI ABDOMEN WITHOUT AND WITH CONTRAST (INCLUDING MRCP) TECHNIQUE: Multiplanar multisequence MR imaging of the abdomen was performed both before and after the administration of intravenous contrast. Heavily T2-weighted images of the biliary and pancreatic ducts were obtained, and three-dimensional MRCP images were rendered by post processing. CONTRAST:  57m GADAVIST GADOBUTROL 1 MMOL/ML IV SOLN COMPARISON:  CT of 1 day prior FINDINGS: Moderately motion degraded exam. The extent of motion makes LI-RADS characterisation impossible. Lower chest: Mild cardiomegaly, without pericardial or pleural effusion. Hepatobiliary: Marked cirrhosis, with caudate lobe enlargement and irregular hepatic capsule. Centered in segment 8 is infiltrative relatively diffuse T2 hyperintensity on 09/05, corresponding to restricted diffusion on 43/6 and heterogeneous arterial phase hyperenhancement including on 40/1101. Example at on the order of 10.1 x 8.7 cm. The T2 signal and restricted diffusion extend towards the right portal vein, which is not opacified on post-contrast images, and likely involved with tumor. Arterial phase images demonstrate multiple other areas of subcentimeter hyperenhancement, not confidently identified on later post-contrast  imaging. Indeterminate. Moderate intrahepatic biliary duct dilatation within the left hepatic lobe and posterior right hepatic lobe, followed to the presumed central right hepatic lobe tumor. Example obstruction on 50 through 53 of 1,103. Small gallstones without specific evidence of acute cholecystitis. No common duct dilatation. Pancreas:  Normal, without mass or ductal dilatation. Spleen:  Splenomegaly at 15.5 cm craniocaudal. Adrenals/Urinary Tract: Normal adrenal glands. Bilateral small renal cysts. No hydronephrosis. Stomach/Bowel: Normal stomach and abdominal bowel loops. Vascular/Lymphatic: Aortic atherosclerosis. The main portal vein and splenic veins are patent. Extensive portosystemic collaterals. 1.2 cm periesophageal node at the level of the diaphragmatic hiatus on 11/05. Gastrohepatic ligament node measures 1.5 cm on 77/1103. Other:  No ascites. Musculoskeletal: No acute osseous abnormality. Probable hemangiomas within the midthoracic and mid lumbar spine. IMPRESSION: 1. Moderately motion degraded exam, with limitations above. 2. 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 As above, the extent of motion degradation makes LI-Rads categorization impossible. 3. Upper abdominal adenopathy which could be metastatic or reactive. 4. Portal venous hypertension and splenomegaly. 5. Cholelithiasis Electronically Signed   By: KAbigail MiyamotoM.D.   On: 07/07/2021 06:25    Scheduled Meds:  atorvastatin  40 mg Oral QHS   enoxaparin (LOVENOX) injection  40 mg Subcutaneous QHS   insulin aspart  0-15  Units Subcutaneous TID WC   insulin aspart  0-5 Units Subcutaneous QHS   insulin aspart  2 Units Subcutaneous TID WC   irbesartan  150 mg Oral Daily   lactulose  20 g Oral BID   Continuous Infusions:  sodium chloride 75 mL/hr at 07/07/21 2133   PRN  Meds:.acetaminophen **OR** acetaminophen, LORazepam, ondansetron **OR** ondansetron (ZOFRAN) IV   ASSESMENT:     Jaundice.  New dx cirrhosis in non alcoholic, new liver mass susp for [place]. Tumor involves R portal vein and obstructing L hepatic duct, posterior R hepatic cuct.  Hep B surface Ab, Hep B surface Ag, Hep A IgM, HCV all negative.  Feritin 683, awaiting multiple labs for metabollic, immune related liver dz.  AFP, CEA, CA 19-9 pndg.  LFTs are better    Splenomegaly.  Platelets 72 K.      Hyponatremia, resolved.      Hyperglycemia to 245.  No prior DM.    Elevated ammonia level, 76.  No symptoms or exam findings to suggest hepatic encephalopathy.   PLAN   Attempt ERCP?   This will be challenging given size of mass.  Other alternative is PTC.  Dr Rush Landmark is Biliary doc this coming week and will review case.  If we decide to pursue ERCP, the soonest would be Tuesday or Wednesday     Needs EGD for variceal screening.  If plan for ERCP, could do EGD simultaneously.  If no ERCP, then will set up for EGD.   Will have pt NPO after MN in case of endoscopic procedures on Monday.   Stopped lactulose.  He had good bowel movements last night.  He has never had any symptoms or exam findings consistent with hepatic encephalopathy.    Azucena Freed  07/08/2021, 8:17 AM Phone [phone]    Attending Physician's Attestation   I have taken an interval history, reviewed the chart and examined the patient.   Pending evaluation by Dr. Rush Landmark to assess whether stenting feasible.  Please keep NPO after midnight tonight for possible EUS/ERCP. Hold any DVT prophylaxis tomorrow  I agree with the Advanced Practitioner's note, impression, and recommendations with updates and my documentation above.   Dustin Flock, MD [facility]

## 2021-07-09 ENCOUNTER — Encounter (HOSPITAL_COMMUNITY): Payer: Self-pay | Admitting: Internal Medicine

## 2021-07-09 DIAGNOSIS — R970 Elevated carcinoembryonic antigen [CEA]: Secondary | ICD-10-CM | POA: Diagnosis not present

## 2021-07-09 DIAGNOSIS — R978 Other abnormal tumor markers: Secondary | ICD-10-CM

## 2021-07-09 DIAGNOSIS — K7469 Other cirrhosis of liver: Secondary | ICD-10-CM | POA: Diagnosis not present

## 2021-07-09 DIAGNOSIS — R160 Hepatomegaly, not elsewhere classified: Secondary | ICD-10-CM

## 2021-07-09 LAB — COMPREHENSIVE METABOLIC PANEL
ALT: 116 U/L — ABNORMAL HIGH (ref 0–44)
AST: 99 U/L — ABNORMAL HIGH (ref 15–41)
Albumin: 2.7 g/dL — ABNORMAL LOW (ref 3.5–5.0)
Alkaline Phosphatase: 248 U/L — ABNORMAL HIGH (ref 38–126)
Anion gap: 6 (ref 5–15)
BUN: 10 mg/dL (ref 8–23)
CO2: 24 mmol/L (ref 22–32)
Calcium: 8.5 mg/dL — ABNORMAL LOW (ref 8.9–10.3)
Chloride: 105 mmol/L (ref 98–111)
Creatinine, Ser: 0.74 mg/dL (ref 0.61–1.24)
GFR, Estimated: >60 mL/min (ref >60)
Glucose, Bld: 152 mg/dL — ABNORMAL HIGH (ref 70–99)
Potassium: 3.6 mmol/L (ref 3.5–5.1)
Sodium: 135 mmol/L (ref 135–145)
Total Bilirubin: 5.2 mg/dL — ABNORMAL HIGH (ref 0.3–1.2)
Total Protein: 6.6 g/dL (ref 6.5–8.1)

## 2021-07-09 LAB — CBC
HCT: 40.9 % (ref 39.0–52.0)
Hemoglobin: 14.3 g/dL (ref 13.0–17.0)
MCH: 32.8 pg (ref 26.0–34.0)
MCHC: 35 g/dL (ref 30.0–36.0)
MCV: 93.8 fL (ref 80.0–100.0)
Platelets: 71 10*3/uL — ABNORMAL LOW (ref 150–400)
RBC: 4.36 MIL/uL (ref 4.22–5.81)
RDW: 13.9 % (ref 11.5–15.5)
WBC: 4 10*3/uL (ref 4.0–10.5)
nRBC: 0 % (ref 0.0–0.2)

## 2021-07-09 LAB — GLUCOSE, CAPILLARY
Glucose-Capillary: 118 mg/dL — ABNORMAL HIGH (ref 70–99)
Glucose-Capillary: 138 mg/dL — ABNORMAL HIGH (ref 70–99)
Glucose-Capillary: 152 mg/dL — ABNORMAL HIGH (ref 70–99)
Glucose-Capillary: 153 mg/dL — ABNORMAL HIGH (ref 70–99)
Glucose-Capillary: 165 mg/dL — ABNORMAL HIGH (ref 70–99)
Glucose-Capillary: 171 mg/dL — ABNORMAL HIGH (ref 70–99)
Glucose-Capillary: 181 mg/dL — ABNORMAL HIGH (ref 70–99)

## 2021-07-09 LAB — MAGNESIUM: Magnesium: 1.9 mg/dL (ref 1.7–2.4)

## 2021-07-09 MED ORDER — SODIUM CHLORIDE 0.9 % IV SOLN
2.0000 g | INTRAVENOUS | Status: AC
  Administered 2021-07-10: 2 g via INTRAVENOUS
  Filled 2021-07-09 (×2): qty 2

## 2021-07-09 NOTE — H&P (Addendum)
Chief Complaint: Patient was seen in consultation today for image guided PTC/PBD placement and liver lesion bx Chief Complaint  Patient presents with   Jaundice   Diarrhea   at the request of [name]   Referring Physician(s): [name]   Supervising Physician: Ruthann Cancer  Patient Status: [facility] - In-pt  History of Present Illness: Phillip Saunders is a 70 y.o. male with PMH HTN, DM type II, primary hyperparathyroidism who presented to [facility] ED on 07/06/2021 with chief complaint of abdominal discomfort, weight loss, and jaundice underwent CT abdomen pelvis on 07/06/2021 with contrast which showed:   1. Liver cirrhosis. Interim development of moderate intra hepatic biliary dilatation. Poorly visible extrahepatic common bile duct which does not appear dilated at head of pancreas, findings raise concern for hilar obstructing process though no gross mass seen on CT. There is heterogeneous hyperenhancement of right hepatic lobe indeterminate for mass or altered perfusion. Further evaluation with MRI/MRCP is recommended. 2. Mild porta hepatis adenopathy indeterminate for metastatic disease. 3. Splenomegaly and findings suggestive of portal hypertension   Patient was admitted for further evaluation management, subsequent MR abdomen MRCP without contrast on 07/07/2021 showed marked cirrhosis with findings highly suspicious for infiltrative HCC, diffuse intrahepatic biliary duct dilation, and cholelithiasis.  Patient was referred to GI who recommended PTC /PBD placement with IR.  IR was requested for image guided PTC/PBD placement and liver lesion biopsy.  Case was reviewed and approved for PTC/ PBD placement with possible liver lesion biopsy by Dr. Serafina Royals.  Patient was seen in the patient room. Patient sitting in bed eating lunch, not in acute distress. Wife and son at the bedside.  Denise headache, fever, chills, shortness of breath, cough, chest pain, abdominal pain, nausea  ,vomiting, and bleeding.   Past Medical History:  Diagnosis Date   DM2 (diabetes mellitus, type 2) ([place])    Hypertension    Primary hyperparathyroidism ([place])     Past Surgical History:  Procedure Laterality Date   APPENDECTOMY     HERNIA REPAIR     PARATHYROIDECTOMY     VASECTOMY      Allergies: Patient has no known allergies.  Medications: Prior to Admission medications   Medication Sig Start Date End Date Taking? Authorizing Provider  atorvastatin (LIPITOR) 40 MG tablet Take 40 mg by mouth at bedtime. 03/22/16  Yes [provider]  irbesartan (AVAPRO) 150 MG tablet Take 150 mg by mouth daily. 05/07/21  Yes [provider]  metFORMIN (GLUCOPHAGE) 1000 MG tablet Take 1,000 mg by mouth 2 (two) times daily with a meal. 06/21/21  Yes [provider]  RYBELSUS 7 MG TABS Take 7 mg by mouth every morning. 06/25/21  Yes [provider]     Family History  Problem Relation Age of Onset   Pancreatic cancer Neg Hx     Social History   Socioeconomic History   Marital status: Married    Spouse name: Not on file   Number of children: Not on file   Years of education: Not on file   Highest education level: Not on file  Occupational History   Not on file  Tobacco Use   Smoking status: Never   Smokeless tobacco: Never  Substance and Sexual Activity   Alcohol use: Not Currently   Drug use: Never   Sexual activity: Not on file  Other Topics Concern   Not on file  Social History Narrative   Not on file   Social Determinants of Health  Financial Resource Strain: Not on file  Food Insecurity: Not on file  Transportation Needs: Not on file  Physical Activity: Not on file  Stress: Not on file  Social Connections: Not on file     Review of Systems: A 12 point ROS discussed and pertinent positives are indicated in the HPI above.  All other systems are negative.   Vital Signs: BP (!) 127/94 (BP Location: Left Arm)   Pulse 62   Temp 97.9 F  (36.6 C) (Oral)   Resp 16   Ht 5' 3"  (1.6 m)   Wt 196 lb (88.9 kg)   SpO2 97%   BMI 34.72 kg/m   Physical Exam Vitals reviewed.  Constitutional:      General: He is not in acute distress.    Appearance: He is not ill-appearing.  HENT:     Head: Normocephalic and atraumatic.     Mouth/Throat:     Mouth: Mucous membranes are moist.  Eyes:     General: Scleral icterus present.  Cardiovascular:     Rate and Rhythm: Normal rate and regular rhythm.     Pulses: Normal pulses.     Heart sounds: Normal heart sounds.  Pulmonary:     Effort: Pulmonary effort is normal.     Breath sounds: Normal breath sounds.  Abdominal:     General: Abdomen is flat. Bowel sounds are normal.     Palpations: Abdomen is soft.  Musculoskeletal:     Cervical back: Neck supple.  Skin:    General: Skin is warm and dry.     Coloration: Skin is jaundiced. Skin is not pale.  Neurological:     Mental Status: He is alert and oriented to person, place, and time.  Psychiatric:        Mood and Affect: Mood normal.        Behavior: Behavior normal.        Judgment: Judgment normal.       Imaging: CT ABDOMEN PELVIS W CONTRAST  Result Date: 07/06/2021 CLINICAL DATA:  Jaundice EXAM: CT ABDOMEN AND PELVIS WITH CONTRAST TECHNIQUE: Multidetector CT imaging of the abdomen and pelvis was performed using the standard protocol following bolus administration of intravenous contrast. CONTRAST:  119m OMNIPAQUE IOHEXOL 350 MG/ML SOLN COMPARISON:  MRI 07/31/2017, CT 02/06/2012 FINDINGS: Lower chest: Lung bases demonstrate no acute consolidation or effusion. Borderline aneurysmal dilatation of aortic root, similar to 2012. Mild cardiomegaly. Hepatobiliary: Heterogeneous hyperenhancement within the right hepatic lobe. Moderate intra hepatic biliary dilatation. Poorly visible extrahepatic common bile duct, does not appear dilated at head of pancreas. Lobulated liver morphology consistent with cirrhosis. Multiple calcified  gallstones. Pancreas: Unremarkable. No pancreatic ductal dilatation or surrounding inflammatory changes. Spleen: Enlarged measuring 15.5 cm Adrenals/Urinary Tract: Adrenal glands are normal. Kidneys show no hydronephrosis. Subcentimeter hypodense lesions too small to further characterize. The bladder is unremarkable Stomach/Bowel: The stomach is nonenlarged. No dilated small bowel. No acute bowel wall thickening. Vascular/Lymphatic: Nonaneurysmal aorta. Multiple enlarged porta hepatis lymph nodes measuring up to 16 mm. Distal esophageal node measuring 10 mm. Numerous small tortuous vessels in the left upper quadrant. Reproductive: Prostate is unremarkable. Other: No free air. No significant ascites. Fat containing right inguinal hernia. Musculoskeletal: No acute osseous abnormality IMPRESSION: 1. Liver cirrhosis. Interim development of moderate intra hepatic biliary dilatation. Poorly visible extrahepatic common bile duct which does not appear dilated at head of pancreas, findings raise concern for hilar obstructing process though no gross mass seen on CT. There is heterogeneous hyperenhancement of  right hepatic lobe indeterminate for mass or altered perfusion. Further evaluation with MRI/MRCP is recommended. 2. Mild porta hepatis adenopathy indeterminate for metastatic disease. 3. Splenomegaly and findings suggestive of portal hypertension Electronically Signed   By: Donavan Foil M.D.   On: 07/06/2021 21:35   MR ABDOMEN MRCP W WO CONTAST  Result Date: 07/07/2021 CLINICAL DATA:  Recent jaundice. CT demonstrating cirrhosis and possible primary malignancy. EXAM: MRI ABDOMEN WITHOUT AND WITH CONTRAST (INCLUDING MRCP) TECHNIQUE: Multiplanar multisequence MR imaging of the abdomen was performed both before and after the administration of intravenous contrast. Heavily T2-weighted images of the biliary and pancreatic ducts were obtained, and three-dimensional MRCP images were rendered by post processing. CONTRAST:   63m GADAVIST GADOBUTROL 1 MMOL/ML IV SOLN COMPARISON:  CT of 1 day prior FINDINGS: Moderately motion degraded exam. The extent of motion makes LI-RADS characterisation impossible. Lower chest: Mild cardiomegaly, without pericardial or pleural effusion. Hepatobiliary: Marked cirrhosis, with caudate lobe enlargement and irregular hepatic capsule. Centered in segment 8 is infiltrative relatively diffuse T2 hyperintensity on 09/05, corresponding to restricted diffusion on 43/6 and heterogeneous arterial phase hyperenhancement including on 40/1101. Example at on the order of 10.1 x 8.7 cm. The T2 signal and restricted diffusion extend towards the right portal vein, which is not opacified on post-contrast images, and likely involved with tumor. Arterial phase images demonstrate multiple other areas of subcentimeter hyperenhancement, not confidently identified on later post-contrast imaging. Indeterminate. Moderate intrahepatic biliary duct dilatation within the left hepatic lobe and posterior right hepatic lobe, followed to the presumed central right hepatic lobe tumor. Example obstruction on 50 through 53 of 1,103. Small gallstones without specific evidence of acute cholecystitis. No common duct dilatation. Pancreas:  Normal, without mass or ductal dilatation. Spleen:  Splenomegaly at 15.5 cm craniocaudal. Adrenals/Urinary Tract: Normal adrenal glands. Bilateral small renal cysts. No hydronephrosis. Stomach/Bowel: Normal stomach and abdominal bowel loops. Vascular/Lymphatic: Aortic atherosclerosis. The main portal vein and splenic veins are patent. Extensive portosystemic collaterals. 1.2 cm periesophageal node at the level of the diaphragmatic hiatus on 11/05. Gastrohepatic ligament node measures 1.5 cm on 77/1103. Other:  No ascites. Musculoskeletal: No acute osseous abnormality. Probable hemangiomas within the midthoracic and mid lumbar spine. IMPRESSION: 1. Moderately motion degraded exam, with limitations above. 2.  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 As above, the extent of motion degradation makes LI-Rads categorization impossible. 3. Upper abdominal adenopathy which could be metastatic or reactive. 4. Portal venous hypertension and splenomegaly. 5. Cholelithiasis Electronically Signed   By: KAbigail MiyamotoM.D.   On: 07/07/2021 06:25    Labs:  CBC: Recent Labs    07/06/21 1925 07/07/21 0205 07/08/21 0117 07/09/21 0055  WBC 5.2 4.5 3.6* 4.0  HGB 14.7 14.4 13.9 14.3  HCT 43.1 41.9 41.5 40.9  PLT 90* 75* 72* 71*    COAGS: Recent Labs    07/06/21 2329 07/08/21 0117  INR 1.1 1.0  APTT 31  --     BMP: Recent Labs    07/06/21 1925 07/07/21 0205 07/08/21 0117 07/09/21 0055  NA 131* 132* 135 135  K 3.5 3.4* 3.5 3.6  CL 102 102 103 105  CO2 22 21* 27 24  GLUCOSE 173* 245* 186* 152*  BUN 16 12 12 10   CALCIUM 8.5* 8.2* 8.2* 8.5*  CREATININE 0.85 0.82 0.83 0.74  GFRNONAA >60 >60 >60 >60    LIVER FUNCTION TESTS:  Recent Labs    07/06/21 1925 07/07/21 0205 07/07/21 1109 07/08/21 0117 07/09/21 0055  BILITOT 7.0* 7.9* 7.0* 5.5* 5.2*  AST 126* 125*  --  103* 99*  ALT 149* 143*  --  125* 116*  ALKPHOS 229* 237*  --  233* 248*  PROT 7.3 6.7  --  6.5 6.6  ALBUMIN 3.1* 2.8*  --  2.6* 2.7*    TUMOR MARKERS: No results for input(s): AFPTM, CEA, CA199, CHROMGRNA in the last 8760 hours.  Assessment and Plan: 70 y.o. male with recent imaging finding highly suspicious for infiltrative HCC, also with biliary duct dilation and cholelithiasis.  Patient was evaluated by GI, who recommended PTC/PBD placement and possible liver lesion biopsy with IR.  IR was requested for PTC/PBD placement with possible liver lesion biopsy. Case was reviewed and approved by Dr. Serafina Royals.  GI made patient n.p.o. at midnight. VSS CBC with  thrombocytopenia PLT 71 CMP with elevated LFT AST 99, ALT 116, T bili 5.2 INR 1.0 on 07/08/2021 -on Lovenox 40 mg, 2200 hours dose held. Cefoxitin 2 gm ordered   Risks and benefits of PTC/PBD discussed with the patient including, but not limited to bleeding, infection which may lead to sepsis or even death and damage to adjacent structures.  Risks and benefits of liver lesion bx was discussed with the patient and/or patient's family including, but not limited to bleeding, infection, damage to adjacent structures or low yield requiring additional tests.  This interventional procedure involves the use of X-rays and because of the nature of the planned procedure, it is possible that we will have prolonged use of X-ray fluoroscopy.  Potential radiation risks to you include (but are not limited to) the following: - A slightly elevated risk for cancer  several years later in life. This risk is typically less than 0.5% percent. This risk is low in comparison to the normal incidence of human cancer, which is 33% for women and 50% for men according to the [place]. - Radiation induced injury can include skin redness, resembling a rash, tissue breakdown / ulcers and hair loss (which can be temporary or permanent).   The likelihood of either of these occurring depends on the difficulty of the procedure and whether you are sensitive to radiation due to previous procedures, disease, or genetic conditions.   IF your procedure requires a prolonged use of radiation, you will be notified and given written instructions for further action.  It is your responsibility to monitor the irradiated area for the 2 weeks following the procedure and to notify your physician if you are concerned that you have suffered a radiation induced injury.    All of the patient's questions were answered, patient is agreeable to proceed.  Consent signed and in chart.   Thank you for this interesting consult.  I  greatly enjoyed meeting Naomi Fitton and look forward to participating in their care.  A copy of this report was sent to the requesting provider on this date.  Electronically Signed: Tera Mater, PA-C 07/09/2021, 1:09 PM   I spent a total of 40 Minutes    in face to face in clinical consultation, greater than 50% of which was counseling/coordinating care for PTC/PBD and possible liver lesion bx.

## 2021-07-09 NOTE — Progress Notes (Addendum)
Progress Note  Chief Complaint:    jaundice     ASSESSMENT AND PLAN   # 70 yo male with DM, HTN, obesity.  Admitted 8/20 with abdominal discomfort, weight loss, jaundice. MRCP suggests cirrhosis with portal HTN ( new for patient), biliary duct dilation, cholelithiasis and suspected infiltrative hepatocellular carcinoma.  Dr. Rush Landmark ( one of our advanced endoscopists) reviewed imaging. The mass is quite large and is causing upstream dilation of multiple segments on the left and right with a very normal CBD diameter.   Biliary decompression not likely to be completely successful at traversing both areas with long biliary stents.  Recommend PTC/PBD at this point . [place] is more likely but certainly cholangiocarcinoma considered .  --CA 19-9 elevatd at 417, AFP 5.9, CEA 8.1 --If Interventional Radiology cannot perform a biopsy of the lesion at the time of PTC/PBD, Dr. Rush Landmark may attempt EUS.  --Will let him eat today, has been NPO since yesterday pending our decision about endoscopic procedure.   Addendum: I spoke with Dr. Serafina Royals in IR. They will see today for probable percutaneous drain tomorrow. I have made him NPO after MN     DIAGNOSTIC STUDIES  MRI / MRCP w/ and WO contrast IMPRESSION: 1. Moderately motion degraded exam, with limitations above. 2. 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 As above, the extent of motion degradation makes LI-Rads categorization impossible. 3. Upper abdominal adenopathy which could be metastatic or reactive. 4. Portal venous hypertension and splenomegaly. 5. Cholelithiasis     SUBJECTIVE   No complaints       OBJECTIVE      Scheduled inpatient medications:   atorvastatin  40 mg Oral QHS   enoxaparin (LOVENOX) injection  40 mg Subcutaneous QHS   insulin aspart  0-15  Units Subcutaneous TID WC   insulin aspart  0-5 Units Subcutaneous QHS   insulin aspart  2 Units Subcutaneous TID WC   irbesartan  150 mg Oral Daily   Continuous inpatient infusions:  PRN inpatient medications: acetaminophen **OR** acetaminophen, LORazepam, ondansetron **OR** ondansetron (ZOFRAN) IV  Vital signs in last 24 hours: Temp:  [97.9 F (36.6 C)-98.5 F (36.9 C)] 97.9 F (36.6 C) (08/22 0806) Pulse Rate:  [61-65] 62 (08/22 0806) Resp:  [16-19] 16 (08/22 0806) BP: (111-128)/(82-94) 127/94 (08/22 1046) SpO2:  [97 %-98 %] 97 % (08/22 0806) Last BM Date: 07/08/21  Intake/Output Summary (Last 24 hours) at 07/09/2021 1217 Last data filed at 07/09/2021 0900 Gross per 24 hour  Intake 240 ml  Output --  Net 240 ml     Physical Exam:  General: Alert male in NAD Heart:  Regular rate and rhythm. No lower extremity edema Pulmonary: Normal respiratory effort Abdomen: Soft, nondistended, protuberant, nontender. Normal bowel sounds.  Neurologic: Alert and oriented Psych: Pleasant. Cooperative.   Filed Weights   07/06/21 1857  Weight: 88.9 kg    Intake/Output from previous day: 08/21 0701 - 08/22 0700 In: 830.9 [P.O.:480; I.V.:350.9] Out: -  Intake/Output this shift: No intake/output data recorded.    Lab Results: Recent Labs    07/07/21 0205 07/08/21 0117 07/09/21 0055  WBC 4.5 3.6* 4.0  HGB 14.4 13.9 14.3  HCT 41.9 41.5 40.9  PLT 75* 72* 71*   BMET Recent Labs    07/07/21 0205 07/08/21 0117 07/09/21 0055  NA 132* 135 135  K 3.4* 3.5 3.6  CL 102 103 105  CO2 21* 27 24  GLUCOSE 245* 186* 152*  BUN _0 CREATININE 0.82 0.83 0.74  CALCIUM 8.2* 8.2* 8.5*   LFT Recent Labs    07/07/21 1109 07/08/21 0117 07/09/21 0055  PROT  --    < > 6.6  ALBUMIN  --    < > 2.7*  AST  --    < > 99*  ALT  --    < > 116*  ALKPHOS  --    < > 248*  BILITOT 7.0*   < > 5.2*  BILIDIR 3.8*  --   --   IBILI 3.2*  --   --    < > = values in this interval not  displayed.   PT/INR Recent Labs    07/06/21 2329 07/08/21 0117  LABPROT 14.4 13.6  INR 1.1 1.0   Hepatitis Panel Recent Labs    07/06/21 1925  HEPBSAG NON REACTIVE  HCVAB NON REACTIVE  HEPAIGM NON REACTIVE  HEPBIGM NON REACTIVE    No results found.      Principal Problem:   Acquired dilation of bile duct Active Problems:   Benign hypertension   Cirrhosis of liver (HCC)   DM2 (diabetes mellitus, type 2) ([place])   Hepatobiliary tract disease   Liver mass   Jaundice     LOS: 2 days   Tye Savoy ,NP 07/09/2021, 12:17 PM

## 2021-07-09 NOTE — Progress Notes (Signed)
PROGRESS NOTE    Phillip Saunders  RSW:[id number] DOB: 1951-08-21 DOA: 07/06/2021 PCP: Algis Greenhouse, MD    Brief Narrative:  This 70 years old male with PMH significant for diabetes, hypertension presented in the ED with c/o: Nausea and vomiting ongoing for 4 days prior to admission along with new onset of painless jaundice.  CT abdomen and pelvis showed cirrhosis with moderate intrahepatic ductal dilatation, questionable CBD obstruction,  right hepatic lobe mass, splenomegaly and portal hypertension.  GI was consulted.  AFP 5.9, CA 19-9 elevated at 417, CEA elevated at 8.1.  MRCP and CT reports worrisome for cholangiocarcinoma.  GI recommended ERCP for biliary decompression not likely to be successful, recommended IR consult for PTC or PBD and concurrent biopsy of the lesion.  Assessment & Plan:   Principal Problem:   Acquired dilation of bile duct Active Problems:   Benign hypertension   Cirrhosis of liver (HCC)   DM2 (diabetes mellitus, type 2) (HCC)   Hepatobiliary tract disease   Liver mass   Jaundice   Elevated CA 19-9 level   Elevated carcinoembryonic antigen (CEA)   Painless jaundice: Patient presented with nausea, vomiting, weight loss and painless jaundice. CT abdomen shows obstructive pathology likely secondary to malignancy seen on MRI. Elevated tumor markers AFP 5.9, CA 19-9 417, CEA 8.1. Unclear etiology about cirrhosis, denies any EtOH intake, NASH, autoimmune. Acute hepatitis panel negative. Ammonia 76,  MRCP: Cirrhosis with concerns of infiltrative hepatocellular carcinoma, right portal vein tumor. GI is on the board,  initial plan was ERCP/ EUS. GI recommended ERCP for biliary decompression is not likely to be successful. Recommended IR consult for PTC or PBD and concurrent biopsy of the liver lesion.   Essential hypertension : Continue Avapro  Type 2 diabetes: Continue insulin sliding scale, hold metformin  Hyperlipidemia : Continue statin    DVT  prophylaxis: Lovenox Code Status: Full code Family Communication: Wife at bedside Disposition Plan:   Status is: Inpatient  Remains inpatient appropriate because:Inpatient level of care appropriate due to severity of illness  Dispo: The patient is from: Home              Anticipated d/c is to: Home              Patient currently is not medically stable to d/c.   Difficult to place patient No  Consultants:  GI IR  Procedures:  Antimicrobials:   Anti-infectives (From admission, onward)    Start     Dose/Rate Route Frequency Ordered Stop   07/10/21 0800  cefOXitin (MEFOXIN) 2 g in sodium chloride 0.9 % 100 mL IVPB        2 g 200 mL/hr over 30 Minutes Intravenous To Radiology 07/09/21 1407 07/11/21 0800       Subjective: Patient was seen and examined at bedside.  Overnight events noted.   Patient reports feeling much better and want to be discharged.  He reported jaundice is improved.  Objective: Vitals:   07/08/21 2027 07/09/21 0341 07/09/21 0806 07/09/21 1046  BP: 128/89 (!) 115/93 (!) 116/92 (!) 127/94  Pulse: 64 62 62   Resp: _0 Temp: 98.4 F (36.9 C) 98.4 F (36.9 C) 97.9 F (36.6 C)   TempSrc: Oral Oral Oral   SpO2: 98% 97% 97%   Weight:      Height:        Intake/Output Summary (Last 24 hours) at 07/09/2021 1517 Last data filed at 07/09/2021 1300 Gross per 24 hour  Intake 480 ml  Output --  Net 480 ml   Filed Weights   07/06/21 1857  Weight: 88.9 kg    Examination:  General exam: Appears comfortable, not in any acute distress.  Jaundice seems improved. Respiratory system: Clear to auscultation bilaterally, no wheezes. Cardiovascular system: S1 & S2 heard, RRR. No JVD, murmurs, rubs, gallops or clicks. No pedal edema. Gastrointestinal system: Abdomen is soft, nontender, nondistended.  Bowel sounds+ Central nervous system: Alert and oriented. No focal neurological deficits. Extremities: No edema, no cyanosis, no clubbing. Skin: No rashes,  lesions or ulcers Psychiatry: Judgement and insight appear normal. Mood & affect appropriate.     Data Reviewed: I have personally reviewed following labs and imaging studies  CBC: Recent Labs  Lab 07/06/21 1925 07/07/21 0205 07/08/21 0117 07/09/21 0055  WBC 5.2 4.5 3.6* 4.0  NEUTROABS 3.8  --   --   --   HGB 14.7 14.4 13.9 14.3  HCT 43.1 41.9 41.5 40.9  MCV 96.0 94.6 95.4 93.8  PLT 90* 75* 72* 71*   Basic Metabolic Panel: Recent Labs  Lab 07/06/21 1925 07/07/21 0205 07/08/21 0117 07/09/21 0055  NA 131* 132* 135 135  K 3.5 3.4* 3.5 3.6  CL 102 102 103 105  CO2 22 21* 27 24  GLUCOSE 173* 245* 186* 152*  BUN _0 CREATININE 0.85 0.82 0.83 0.74  CALCIUM 8.5* 8.2* 8.2* 8.5*  MG  --   --  2.0 1.9   GFR: Estimated Creatinine Clearance: 87.1 mL/min (by C-G formula based on SCr of 0.74 mg/dL). Liver Function Tests: Recent Labs  Lab 07/06/21 1925 07/07/21 0205 07/07/21 1109 07/08/21 0117 07/09/21 0055  AST 126* 125*  --  103* 99*  ALT 149* 143*  --  125* 116*  ALKPHOS 229* 237*  --  233* 248*  BILITOT 7.0* 7.9* 7.0* 5.5* 5.2*  PROT 7.3 6.7  --  6.5 6.6  ALBUMIN 3.1* 2.8*  --  2.6* 2.7*   Recent Labs  Lab 07/06/21 1925  LIPASE 51   Recent Labs  Lab 07/06/21 2329  AMMONIA 76*   Coagulation Profile: Recent Labs  Lab 07/06/21 2329 07/08/21 0117  INR 1.1 1.0   Cardiac Enzymes: No results for input(s): CKTOTAL, CKMB, CKMBINDEX, TROPONINI in the last 168 hours. BNP (last 3 results) No results for input(s): PROBNP in the last 8760 hours. HbA1C: Recent Labs    07/06/21 2324  HGBA1C 9.3*   CBG: Recent Labs  Lab 07/08/21 2001 07/09/21 0003 07/09/21 0357 07/09/21 0804 07/09/21 1143  GLUCAP 211* 171* 152* 138* 118*   Lipid Profile: No results for input(s): CHOL, HDL, LDLCALC, TRIG, CHOLHDL, LDLDIRECT in the last 72 hours. Thyroid Function Tests: No results for input(s): TSH, T4TOTAL, FREET4, T3FREE, THYROIDAB in the last 72  hours. Anemia Panel: Recent Labs    07/07/21 1106 07/08/21 0117  FERRITIN 764* 683*  TIBC  --  267  IRON  --  122  RETICCTPCT  --  2.0   Sepsis Labs: No results for input(s): PROCALCITON, LATICACIDVEN in the last 168 hours.  Recent Results (from the past 240 hour(s))  Resp Panel by RT-PCR (Flu A&B, Covid) Nasopharyngeal Swab     Status: None   Collection Time: 07/06/21  7:15 PM   Specimen: Nasopharyngeal Swab; Nasopharyngeal(NP) swabs in vial transport medium  Result Value Ref Range Status   SARS Coronavirus 2 by RT PCR NEGATIVE NEGATIVE Final    Comment: (NOTE) SARS-CoV-2 target nucleic acids are  NOT DETECTED.  The SARS-CoV-2 RNA is generally detectable in upper respiratory specimens during the acute phase of infection. The lowest concentration of SARS-CoV-2 viral copies this assay can detect is 138 copies/mL. A negative result does not preclude SARS-Cov-2 infection and should not be used as the sole basis for treatment or other patient management decisions. A negative result may occur with  improper specimen collection/handling, submission of specimen other than nasopharyngeal swab, presence of viral mutation(s) within the areas targeted by this assay, and inadequate number of viral copies(<138 copies/mL). A negative result must be combined with clinical observations, patient history, and epidemiological information. The expected result is Negative.  Fact Sheet for Patients:  EntrepreneurPulse.com.au  Fact Sheet for Healthcare Providers:  IncredibleEmployment.be  This test is no t yet approved or cleared by the [place] FDA and  has been authorized for detection and/or diagnosis of SARS-CoV-2 by FDA under an Emergency Use Authorization (EUA). This EUA will remain  in effect (meaning this test can be used) for the duration of the COVID-19 declaration under Section 564(b)(1) of the Act, 21 U.S.C.section 360bbb-3(b)(1), unless the  authorization is terminated  or revoked sooner.       Influenza A by PCR NEGATIVE NEGATIVE Final   Influenza B by PCR NEGATIVE NEGATIVE Final    Comment: (NOTE) The Xpert Xpress SARS-CoV-2/FLU/RSV plus assay is intended as an aid in the diagnosis of influenza from Nasopharyngeal swab specimens and should not be used as a sole basis for treatment. Nasal washings and aspirates are unacceptable for Xpert Xpress SARS-CoV-2/FLU/RSV testing.  Fact Sheet for Patients: EntrepreneurPulse.com.au  Fact Sheet for Healthcare Providers: IncredibleEmployment.be  This test is not yet approved or cleared by the [place] FDA and has been authorized for detection and/or diagnosis of SARS-CoV-2 by FDA under an Emergency Use Authorization (EUA). This EUA will remain in effect (meaning this test can be used) for the duration of the COVID-19 declaration under Section 564(b)(1) of the Act, 21 U.S.C. section 360bbb-3(b)(1), unless the authorization is terminated or revoked.  Performed at [facility], [place] [address], [place], [place] [address]      Radiology Studies: No results found.  Scheduled Meds:  atorvastatin  40 mg Oral QHS   enoxaparin (LOVENOX) injection  40 mg Subcutaneous QHS   insulin aspart  0-15 Units Subcutaneous TID WC   insulin aspart  0-5 Units Subcutaneous QHS   insulin aspart  2 Units Subcutaneous TID WC   irbesartan  150 mg Oral Daily   Continuous Infusions:  [START ON 07/10/2021] cefOXitin       LOS: 2 days    Time spent: 35 mins    Jaymi Tinner, MD [facility]   If 7PM-7AM, please contact night-coverage

## 2021-07-10 ENCOUNTER — Encounter (HOSPITAL_COMMUNITY): Payer: Self-pay | Admitting: Interventional Radiology

## 2021-07-10 ENCOUNTER — Inpatient Hospital Stay (HOSPITAL_COMMUNITY): Payer: Medicare HMO

## 2021-07-10 HISTORY — PX: IR INT EXT BILIARY DRAIN WITH CHOLANGIOGRAM: IMG6044

## 2021-07-10 HISTORY — PX: IR ENDOLUMINAL BX OF BILIARY TREE: IMG6053

## 2021-07-10 HISTORY — PX: IR US GUIDE BX ASP/DRAIN: IMG2392

## 2021-07-10 LAB — CBC
HCT: 42.5 % (ref 39.0–52.0)
Hemoglobin: 14.6 g/dL (ref 13.0–17.0)
MCH: 32.4 pg (ref 26.0–34.0)
MCHC: 34.4 g/dL (ref 30.0–36.0)
MCV: 94.2 fL (ref 80.0–100.0)
Platelets: 79 10*3/uL — ABNORMAL LOW (ref 150–400)
RBC: 4.51 MIL/uL (ref 4.22–5.81)
RDW: 14 % (ref 11.5–15.5)
WBC: 4.6 10*3/uL (ref 4.0–10.5)
nRBC: 0 % (ref 0.0–0.2)

## 2021-07-10 LAB — COMPREHENSIVE METABOLIC PANEL
ALT: 107 U/L — ABNORMAL HIGH (ref 0–44)
AST: 100 U/L — ABNORMAL HIGH (ref 15–41)
Albumin: 2.7 g/dL — ABNORMAL LOW (ref 3.5–5.0)
Alkaline Phosphatase: 256 U/L — ABNORMAL HIGH (ref 38–126)
Anion gap: 7 (ref 5–15)
BUN: 13 mg/dL (ref 8–23)
CO2: 25 mmol/L (ref 22–32)
Calcium: 8.7 mg/dL — ABNORMAL LOW (ref 8.9–10.3)
Chloride: 105 mmol/L (ref 98–111)
Creatinine, Ser: 0.85 mg/dL (ref 0.61–1.24)
GFR, Estimated: >60 mL/min (ref >60)
Glucose, Bld: 136 mg/dL — ABNORMAL HIGH (ref 70–99)
Potassium: 3.6 mmol/L (ref 3.5–5.1)
Sodium: 137 mmol/L (ref 135–145)
Total Bilirubin: 5.7 mg/dL — ABNORMAL HIGH (ref 0.3–1.2)
Total Protein: 6.6 g/dL (ref 6.5–8.1)

## 2021-07-10 LAB — GLUCOSE, CAPILLARY
Glucose-Capillary: 122 mg/dL — ABNORMAL HIGH (ref 70–99)
Glucose-Capillary: 135 mg/dL — ABNORMAL HIGH (ref 70–99)
Glucose-Capillary: 126 mg/dL — ABNORMAL HIGH (ref 70–99)
Glucose-Capillary: 162 mg/dL — ABNORMAL HIGH (ref 70–99)

## 2021-07-10 LAB — MAGNESIUM: Magnesium: 1.9 mg/dL (ref 1.7–2.4)

## 2021-07-10 LAB — MITOCHONDRIAL ANTIBODIES: Mitochondrial M2 Ab, IgG: <20 Units (ref 0.0–20.0)

## 2021-07-10 MED ORDER — SODIUM CHLORIDE 0.9% FLUSH
5.0000 mL | Freq: Three times a day (TID) | INTRAVENOUS | Status: DC
  Administered 2021-07-10 – 2021-07-14 (×11): 5 mL

## 2021-07-10 MED ORDER — GELATIN ABSORBABLE 12-7 MM EX MISC
CUTANEOUS | Status: AC
  Filled 2021-07-10: qty 1

## 2021-07-10 MED ORDER — FENTANYL CITRATE (PF) 100 MCG/2ML IJ SOLN
INTRAMUSCULAR | Status: AC | PRN
  Administered 2021-07-10 (×4): 50 ug via INTRAVENOUS

## 2021-07-10 MED ORDER — LIDOCAINE HCL 1 % IJ SOLN
INTRAMUSCULAR | Status: AC
  Filled 2021-07-10: qty 20

## 2021-07-10 MED ORDER — ONDANSETRON HCL 4 MG/2ML IJ SOLN
4.0000 mg | Freq: Four times a day (QID) | INTRAMUSCULAR | Status: DC
  Administered 2021-07-11 – 2021-07-12 (×3): 4 mg via INTRAVENOUS
  Filled 2021-07-10 (×6): qty 2

## 2021-07-10 MED ORDER — MIDAZOLAM HCL 2 MG/2ML IJ SOLN
INTRAMUSCULAR | Status: AC
  Filled 2021-07-10: qty 6

## 2021-07-10 MED ORDER — TRAMADOL HCL 50 MG PO TABS
50.0000 mg | ORAL_TABLET | Freq: Once | ORAL | Status: AC
Start: 2021-07-10 — End: 2021-07-10
  Administered 2021-07-10: 50 mg via ORAL
  Filled 2021-07-10: qty 1

## 2021-07-10 MED ORDER — IOHEXOL 300 MG/ML  SOLN
50.0000 mL | Freq: Once | INTRAMUSCULAR | Status: AC | PRN
  Administered 2021-07-10: 25 mL

## 2021-07-10 MED ORDER — SODIUM CHLORIDE 0.9 % IV SOLN: INTRAVENOUS | Status: DC

## 2021-07-10 MED ORDER — MIDAZOLAM HCL 2 MG/2ML IJ SOLN
INTRAMUSCULAR | Status: AC | PRN
  Administered 2021-07-10: 0.5 mg via INTRAVENOUS
  Administered 2021-07-10 (×3): 1 mg via INTRAVENOUS
  Administered 2021-07-10: 0.5 mg via INTRAVENOUS

## 2021-07-10 MED ORDER — ENOXAPARIN SODIUM 40 MG/0.4ML IJ SOSY
40.0000 mg | PREFILLED_SYRINGE | Freq: Every day | INTRAMUSCULAR | Status: DC
  Administered 2021-07-11 – 2021-07-12 (×2): 40 mg via SUBCUTANEOUS
  Filled 2021-07-10 (×2): qty 0.4

## 2021-07-10 MED ORDER — FENTANYL CITRATE (PF) 100 MCG/2ML IJ SOLN
INTRAMUSCULAR | Status: AC
  Filled 2021-07-10: qty 4

## 2021-07-10 NOTE — Sedation Documentation (Signed)
Pt NSR 60's at baseline. Pt had several runs of NRS with freq PAC's and several runs of ST 100's. Pt asymptomatic. Several rhythm strips recorded and attached to chart. Dr. Dwyane Dee and pts RN, Argentina Donovan aware.

## 2021-07-10 NOTE — Procedures (Signed)
Interventional Radiology Procedure Note  Procedure:  1.) Percutaneous cholagngiogram 2.) Brush biopsy obstructing intrabiliary lesion 3.) Placement of 41F int/ext biliary drain 4.) US guided bx of region of abnormality in hepatic segment 8   Complications: None  Estimated Blood Loss: None  Recommendations: - Drain to bag until Br has trended down significantly, can then trial capping - Path sent   Signed,  Criselda Peaches, MD

## 2021-07-10 NOTE — Progress Notes (Signed)
PROGRESS NOTE    Phillip Saunders  OZH:[id number] DOB: 08-20-1951 DOA: 07/06/2021 PCP: Algis Greenhouse, MD    Brief Narrative:  This 70 years old male with PMH significant for diabetes, hypertension presented in the ED with c/o: Nausea and vomiting ongoing for 4 days prior to admission along with new onset of painless jaundice.  CT abdomen and pelvis showed cirrhosis with moderate intrahepatic ductal dilatation, questionable CBD obstruction,  right hepatic lobe mass, splenomegaly and portal hypertension.  GI was consulted.  AFP 5.9, CA 19-9 elevated at 417, CEA elevated at 8.1.  MRCP and CT reports worrisome for cholangiocarcinoma.  GI recommended ERCP for biliary decompression not likely to be successful, recommended IR consult for PTC or PBD and concurrent biopsy of the lesion.  Assessment & Plan:   Principal Problem:   Acquired dilation of bile duct Active Problems:   Benign hypertension   Cirrhosis of liver (HCC)   DM2 (diabetes mellitus, type 2) (HCC)   Hepatobiliary tract disease   Liver mass   Jaundice   Elevated CA 19-9 level   Elevated carcinoembryonic antigen (CEA)   Painless jaundice: Patient presented with nausea, vomiting, weight loss and painless jaundice. CT abdomen shows obstructive pathology likely secondary to malignancy seen on MRI. Elevated tumor markers AFP 5.9, CA 19-9 417, CEA 8.1. Unclear etiology about cirrhosis, denies any EtOH intake, NASH, autoimmune. Acute hepatitis panel negative. Ammonia 76,  MRCP: Cirrhosis with concerns of infiltrative hepatocellular carcinoma, right portal vein tumor. GI is on the board,  initial plan was ERCP/ EUS. GI recommended ERCP for biliary decompression is not likely to be successful. Recommended IR consult for PTC or PBD and concurrent biopsy of the liver lesion. Patient underwent percutaneous cholangiogram, percutaneous biliary drain and brush biopsy of the liver lesion. Liver enzymes trending down, continue to  monitor  Nausea and vomiting: Continue Zofran as needed and IV hydration.   Essential hypertension : Continue Avapro  Type 2 diabetes: Continue insulin sliding scale, hold metformin  Hyperlipidemia : Continue statin.    DVT prophylaxis: Lovenox Code Status: Full code Family Communication: Wife at bedside Disposition Plan:   Status is: Inpatient  Remains inpatient appropriate because:Inpatient level of care appropriate due to severity of illness  Dispo: The patient is from: Home              Anticipated d/c is to: Home              Patient currently is not medically stable to d/c.   Difficult to place patient No  Consultants:  GI IR  Procedures:  Antimicrobials:   Anti-infectives (From admission, onward)    Start     Dose/Rate Route Frequency Ordered Stop   07/10/21 0800  cefOXitin (MEFOXIN) 2 g in sodium chloride 0.9 % 100 mL IVPB        2 g 200 mL/hr over 30 Minutes Intravenous To Radiology 07/09/21 1407 07/10/21 1216       Subjective: Patient was seen and examined at bedside.  Overnight events noted.   Patient reports feeling nauseous denies any vomiting.   He is scheduled to have percutaneous biliary drain today.  Objective: Vitals:   07/10/21 1300 07/10/21 1305 07/10/21 1310 07/10/21 1315  BP: 108/78 106/79 102/82 106/81  Pulse: (!) 102 61 60 (!) 58  Resp: _0 Temp:      TempSrc:      SpO2: 90% 90% 92% 94%  Weight:      Height:  Intake/Output Summary (Last 24 hours) at 07/10/2021 1515 Last data filed at 07/10/2021 1219 Gross per 24 hour  Intake 120 ml  Output --  Net 120 ml   Filed Weights   07/06/21 1857  Weight: 88.9 kg    Examination:  General exam: Appears comfortable, Jaundice seems improved. No distress. Respiratory system: Clear to auscultation bilaterally, no wheezes. Cardiovascular system: S1 & S2 heard, RRR. No JVD, murmurs, rubs, gallops or clicks. No pedal edema. Gastrointestinal system: Abdomen is soft,  nontender, nondistended.  Bowel sounds+ Central nervous system: Alert and oriented. No focal neurological deficits. Extremities: No edema, no cyanosis, no clubbing. Skin: No rashes, lesions or ulcers Psychiatry: Judgement and insight appear normal. Mood & affect appropriate.     Data Reviewed: I have personally reviewed following labs and imaging studies  CBC: Recent Labs  Lab 07/06/21 1925 07/07/21 0205 07/08/21 0117 07/09/21 0055 07/10/21 0123  WBC 5.2 4.5 3.6* 4.0 4.6  NEUTROABS 3.8  --   --   --   --   HGB 14.7 14.4 13.9 14.3 14.6  HCT 43.1 41.9 41.5 40.9 42.5  MCV 96.0 94.6 95.4 93.8 94.2  PLT 90* 75* 72* 71* 79*   Basic Metabolic Panel: Recent Labs  Lab 07/06/21 1925 07/07/21 0205 07/08/21 0117 07/09/21 0055 07/10/21 0123  NA 131* 132* 135 135 137  K 3.5 3.4* 3.5 3.6 3.6  CL 102 102 103 105 105  CO2 22 21* _0 GLUCOSE 173* 245* 186* 152* 136*  BUN _1 CREATININE 0.85 0.82 0.83 0.74 0.85  CALCIUM 8.5* 8.2* 8.2* 8.5* 8.7*  MG  --   --  2.0 1.9 1.9   GFR: Estimated Creatinine Clearance: 82 mL/min (by C-G formula based on SCr of 0.85 mg/dL). Liver Function Tests: Recent Labs  Lab 07/06/21 1925 07/07/21 0205 07/07/21 1109 07/08/21 0117 07/09/21 0055 07/10/21 0123  AST 126* 125*  --  103* 99* 100*  ALT 149* 143*  --  125* 116* 107*  ALKPHOS 229* 237*  --  233* 248* 256*  BILITOT 7.0* 7.9* 7.0* 5.5* 5.2* 5.7*  PROT 7.3 6.7  --  6.5 6.6 6.6  ALBUMIN 3.1* 2.8*  --  2.6* 2.7* 2.7*   Recent Labs  Lab 07/06/21 1925  LIPASE 51   Recent Labs  Lab 07/06/21 2329  AMMONIA 76*   Coagulation Profile: Recent Labs  Lab 07/06/21 2329 07/08/21 0117  INR 1.1 1.0   Cardiac Enzymes: No results for input(s): CKTOTAL, CKMB, CKMBINDEX, TROPONINI in the last 168 hours. BNP (last 3 results) No results for input(s): PROBNP in the last 8760 hours. HbA1C: No results for input(s): HGBA1C in the last 72 hours.  CBG: Recent Labs  Lab  07/09/21 1556 07/09/21 2014 07/09/21 2357 07/10/21 0406 07/10/21 0758  GLUCAP 165* 181* 153* 122* 135*   Lipid Profile: No results for input(s): CHOL, HDL, LDLCALC, TRIG, CHOLHDL, LDLDIRECT in the last 72 hours. Thyroid Function Tests: No results for input(s): TSH, T4TOTAL, FREET4, T3FREE, THYROIDAB in the last 72 hours. Anemia Panel: Recent Labs    07/08/21 0117  FERRITIN 683*  TIBC 267  IRON 122  RETICCTPCT 2.0   Sepsis Labs: No results for input(s): PROCALCITON, LATICACIDVEN in the last 168 hours.  Recent Results (from the past 240 hour(s))  Resp Panel by RT-PCR (Flu A&B, Covid) Nasopharyngeal Swab     Status: None   Collection Time: 07/06/21  7:15 PM   Specimen: Nasopharyngeal Swab; Nasopharyngeal(NP) swabs  in vial transport medium  Result Value Ref Range Status   SARS Coronavirus 2 by RT PCR NEGATIVE NEGATIVE Final    Comment: (NOTE) SARS-CoV-2 target nucleic acids are NOT DETECTED.  The SARS-CoV-2 RNA is generally detectable in upper respiratory specimens during the acute phase of infection. The lowest concentration of SARS-CoV-2 viral copies this assay can detect is 138 copies/mL. A negative result does not preclude SARS-Cov-2 infection and should not be used as the sole basis for treatment or other patient management decisions. A negative result may occur with  improper specimen collection/handling, submission of specimen other than nasopharyngeal swab, presence of viral mutation(s) within the areas targeted by this assay, and inadequate number of viral copies(<138 copies/mL). A negative result must be combined with clinical observations, patient history, and epidemiological information. The expected result is Negative.  Fact Sheet for Patients:  EntrepreneurPulse.com.au  Fact Sheet for Healthcare Providers:  IncredibleEmployment.be  This test is no t yet approved or cleared by the [place] FDA and  has been  authorized for detection and/or diagnosis of SARS-CoV-2 by FDA under an Emergency Use Authorization (EUA). This EUA will remain  in effect (meaning this test can be used) for the duration of the COVID-19 declaration under Section 564(b)(1) of the Act, 21 U.S.C.section 360bbb-3(b)(1), unless the authorization is terminated  or revoked sooner.       Influenza A by PCR NEGATIVE NEGATIVE Final   Influenza B by PCR NEGATIVE NEGATIVE Final    Comment: (NOTE) The Xpert Xpress SARS-CoV-2/FLU/RSV plus assay is intended as an aid in the diagnosis of influenza from Nasopharyngeal swab specimens and should not be used as a sole basis for treatment. Nasal washings and aspirates are unacceptable for Xpert Xpress SARS-CoV-2/FLU/RSV testing.  Fact Sheet for Patients: EntrepreneurPulse.com.au  Fact Sheet for Healthcare Providers: IncredibleEmployment.be  This test is not yet approved or cleared by the [place] FDA and has been authorized for detection and/or diagnosis of SARS-CoV-2 by FDA under an Emergency Use Authorization (EUA). This EUA will remain in effect (meaning this test can be used) for the duration of the COVID-19 declaration under Section 564(b)(1) of the Act, 21 U.S.C. section 360bbb-3(b)(1), unless the authorization is terminated or revoked.  Performed at [facility], [place] [address] [place], [place] [address]      Radiology Studies: IR US Guide Bx Asp/Drain  Result Date: 07/10/2021 Criselda Peaches, MD     07/10/2021  1:26 PM Interventional Radiology Procedure Note Procedure: 1.) Percutaneous cholagngiogram 2.) Brush biopsy obstructing intrabiliary lesion 3.) Placement of 42F int/ext biliary drain 4.) US guided bx of region of abnormality in hepatic segment 8 Complications: None Estimated Blood Loss: None Recommendations: - Drain to bag until Br has trended down significantly, can then trial capping - Path sent Signed, Criselda Peaches, MD   IR INT EXT BILIARY DRAIN WITH CHOLANGIOGRAM  Result Date: 07/10/2021 Criselda Peaches, MD     07/10/2021  1:26 PM Interventional Radiology Procedure Note Procedure: 1.) Percutaneous cholagngiogram 2.) Brush biopsy obstructing intrabiliary lesion 3.) Placement of 42F int/ext biliary drain 4.) US guided bx of region of abnormality in hepatic segment 8 Complications: None Estimated Blood Loss: None Recommendations: - Drain to bag until Br has trended down significantly, can then trial capping - Path sent Signed, Criselda Peaches, MD   IR ENDOLUMINAL BX OF BILIARY TREE  Result Date: 07/10/2021 Criselda Peaches, MD     07/10/2021  1:26 PM Interventional Radiology Procedure Note  Procedure: 1.) Percutaneous cholagngiogram 2.) Brush biopsy obstructing intrabiliary lesion 3.) Placement of 51F int/ext biliary drain 4.) US guided bx of region of abnormality in hepatic segment 8 Complications: None Estimated Blood Loss: None Recommendations: - Drain to bag until Br has trended down significantly, can then trial capping - Path sent Signed, Criselda Peaches, MD    Scheduled Meds:  atorvastatin  40 mg Oral QHS   [START ON 07/11/2021] enoxaparin (LOVENOX) injection  40 mg Subcutaneous Daily   insulin aspart  0-15 Units Subcutaneous TID WC   insulin aspart  0-5 Units Subcutaneous QHS   insulin aspart  2 Units Subcutaneous TID WC   irbesartan  150 mg Oral Daily   sodium chloride flush  5 mL Intracatheter Q8H   Continuous Infusions:    LOS: 3 days    Time spent: 25 mins    Shawna Clamp, MD [facility]   If 7PM-7AM, please contact night-coverage

## 2021-07-11 ENCOUNTER — Inpatient Hospital Stay (HOSPITAL_COMMUNITY): Payer: Medicare HMO

## 2021-07-11 DIAGNOSIS — E860 Dehydration: Secondary | ICD-10-CM

## 2021-07-11 DIAGNOSIS — N179 Acute kidney failure, unspecified: Secondary | ICD-10-CM

## 2021-07-11 DIAGNOSIS — K7469 Other cirrhosis of liver: Secondary | ICD-10-CM | POA: Diagnosis not present

## 2021-07-11 DIAGNOSIS — R109 Unspecified abdominal pain: Secondary | ICD-10-CM

## 2021-07-11 DIAGNOSIS — R160 Hepatomegaly, not elsewhere classified: Secondary | ICD-10-CM | POA: Diagnosis not present

## 2021-07-11 DIAGNOSIS — C221 Intrahepatic bile duct carcinoma: Principal | ICD-10-CM

## 2021-07-11 DIAGNOSIS — R978 Other abnormal tumor markers: Secondary | ICD-10-CM | POA: Diagnosis not present

## 2021-07-11 LAB — COMPREHENSIVE METABOLIC PANEL
ALT: 126 U/L — ABNORMAL HIGH (ref 0–44)
ALT: 125 U/L — ABNORMAL HIGH (ref 0–44)
AST: 123 U/L — ABNORMAL HIGH (ref 15–41)
AST: 115 U/L — ABNORMAL HIGH (ref 15–41)
Albumin: 3.1 g/dL — ABNORMAL LOW (ref 3.5–5.0)
Albumin: 3.4 g/dL — ABNORMAL LOW (ref 3.5–5.0)
Alkaline Phosphatase: 271 U/L — ABNORMAL HIGH (ref 38–126)
Alkaline Phosphatase: 277 U/L — ABNORMAL HIGH (ref 38–126)
Anion gap: 7 (ref 5–15)
Anion gap: 10 (ref 5–15)
BUN: 22 mg/dL (ref 8–23)
BUN: 30 mg/dL — ABNORMAL HIGH (ref 8–23)
CO2: 25 mmol/L (ref 22–32)
CO2: 21 mmol/L — ABNORMAL LOW (ref 22–32)
Calcium: 9.4 mg/dL (ref 8.9–10.3)
Calcium: 9.8 mg/dL (ref 8.9–10.3)
Chloride: 102 mmol/L (ref 98–111)
Chloride: 103 mmol/L (ref 98–111)
Creatinine, Ser: 1.01 mg/dL (ref 0.61–1.24)
Creatinine, Ser: 1.26 mg/dL — ABNORMAL HIGH (ref 0.61–1.24)
GFR, Estimated: >60 mL/min (ref >60)
GFR, Estimated: >60 mL/min (ref >60)
Glucose, Bld: 174 mg/dL — ABNORMAL HIGH (ref 70–99)
Glucose, Bld: 131 mg/dL — ABNORMAL HIGH (ref 70–99)
Potassium: 4.1 mmol/L (ref 3.5–5.1)
Potassium: 4 mmol/L (ref 3.5–5.1)
Sodium: 134 mmol/L — ABNORMAL LOW (ref 135–145)
Sodium: 134 mmol/L — ABNORMAL LOW (ref 135–145)
Total Bilirubin: 5 mg/dL — ABNORMAL HIGH (ref 0.3–1.2)
Total Bilirubin: 5.2 mg/dL — ABNORMAL HIGH (ref 0.3–1.2)
Total Protein: 7.5 g/dL (ref 6.5–8.1)
Total Protein: 8.5 g/dL — ABNORMAL HIGH (ref 6.5–8.1)

## 2021-07-11 LAB — CBC
HCT: 47.5 % (ref 39.0–52.0)
HCT: 51.7 % (ref 39.0–52.0)
Hemoglobin: 16.1 g/dL (ref 13.0–17.0)
Hemoglobin: 17.9 g/dL — ABNORMAL HIGH (ref 13.0–17.0)
MCH: 32.1 pg (ref 26.0–34.0)
MCH: 32.4 pg (ref 26.0–34.0)
MCHC: 33.9 g/dL (ref 30.0–36.0)
MCHC: 34.6 g/dL (ref 30.0–36.0)
MCV: 94.8 fL (ref 80.0–100.0)
MCV: 93.7 fL (ref 80.0–100.0)
Platelets: 123 10*3/uL — ABNORMAL LOW (ref 150–400)
Platelets: 147 10*3/uL — ABNORMAL LOW (ref 150–400)
RBC: 5.01 MIL/uL (ref 4.22–5.81)
RBC: 5.52 MIL/uL (ref 4.22–5.81)
RDW: 14.5 % (ref 11.5–15.5)
RDW: 14.6 % (ref 11.5–15.5)
WBC: 7.2 10*3/uL (ref 4.0–10.5)
WBC: 10.4 10*3/uL (ref 4.0–10.5)
nRBC: 0 % (ref 0.0–0.2)
nRBC: 0 % (ref 0.0–0.2)

## 2021-07-11 LAB — MAGNESIUM: Magnesium: 2.2 mg/dL (ref 1.7–2.4)

## 2021-07-11 LAB — GLUCOSE, CAPILLARY
Glucose-Capillary: 152 mg/dL — ABNORMAL HIGH (ref 70–99)
Glucose-Capillary: 193 mg/dL — ABNORMAL HIGH (ref 70–99)
Glucose-Capillary: 154 mg/dL — ABNORMAL HIGH (ref 70–99)
Glucose-Capillary: 184 mg/dL — ABNORMAL HIGH (ref 70–99)

## 2021-07-11 LAB — CYTOLOGY - NON PAP

## 2021-07-11 LAB — SURGICAL PATHOLOGY

## 2021-07-11 LAB — LIPASE, BLOOD: Lipase: 3155 U/L — ABNORMAL HIGH (ref 11–51)

## 2021-07-11 MED ORDER — TRAMADOL HCL 50 MG PO TABS
50.0000 mg | ORAL_TABLET | Freq: Four times a day (QID) | ORAL | Status: DC | PRN
  Administered 2021-07-11 – 2021-07-14 (×4): 50 mg via ORAL
  Filled 2021-07-11 (×4): qty 1

## 2021-07-11 MED ORDER — HYDROMORPHONE HCL 1 MG/ML IJ SOLN
0.5000 mg | INTRAMUSCULAR | Status: DC | PRN
  Administered 2021-07-13 – 2021-07-14 (×3): 1 mg via INTRAVENOUS
  Filled 2021-07-11 (×4): qty 1

## 2021-07-11 NOTE — Progress Notes (Signed)
PROGRESS NOTE    Madex Seals  BPZ:[id number] DOB: Aug 25, 1951 DOA: 07/06/2021 PCP: Algis Greenhouse, MD    Brief Narrative:  70 year old male with medical history significant for type 2 diabetes mellitus, hypertension, presented to the ED with complaints of nausea and vomiting x4 days and admitted with painless jaundice and unintentional weight loss.  Found to have cirrhosis with portal hypertension and a large hepatic mass worrisome for malignancy.  ERCP for biliary decompression not likely to be successful for biliary stenting.  IR performed percutaneous cholangiogram with placement of percutaneous biliary drain 8/23.  Postprocedure course complicated by severe abdominal pain and high output biliary drain.  GI and IR reevaluating.  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  Oncology consulted.  this 70 years old male with PMH significant for diabetes, hypertension presented in the ED with c/o: Nausea and vomiting ongoing for 4 days prior to admission along with new onset of painless jaundice.  CT abdomen and pelvis showed cirrhosis with moderate intrahepatic ductal dilatation, questionable CBD obstruction,  right hepatic lobe mass, splenomegaly and portal hypertension.  GI was consulted.  AFP 5.9, CA 19-9 elevated at 417, CEA elevated at 8.1.  MRCP and CT reports worrisome for cholangiocarcinoma.  GI recommended ERCP for biliary decompression not likely to be successful, recommended IR consult for PTC or PBD and concurrent biopsy of the lesion.  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 CT abdomen shows obstructive pathology likely secondary to malignancy seen on MRI. Elevated tumor markers AFP 5.9, CA 19-9 417, CEA 8.1. Cirrhosis etiology unclear, denies any EtOH intake, NASH, autoimmune. Acute hepatitis panel negative. Ammonia 76,  MRCP: Cirrhosis with concerns of infiltrative hepatocellular carcinoma, right portal vein tumor. [facility] was consulted and indicated that ERCP for biliary decompression not likely to be successful for biliary stenting. IR then performed percutaneous cholangiogram, brush biopsy of obstructing intrabiliary lesion, placement of 10 French internal/external biliary drain and US guided biopsy of hepatic mass on 07/10/2021 Pathology confirmed adenocarcinoma/cholangiocarcinoma.  Oncology was consulted on 8/24. Postprocedure 8/24, high-volume biliary drain output and progressively worsening abdominal pain. Communicated extensively with Dr. Tarri Glenn, [facility] and IR.  Getting abdominal ultrasound to ensure drain is in place and exclude biopsy related complications.  Repeat CMP now shows stable transaminases and bilirubin, no leukocytosis and hemoglobin up to 17.9 suggesting hemoconcentration. Will add Lipase as well. Due to high volume biliary output, risk for dehydration, increased IV fluids to 200 mL/h, multimodality pain control.  Acute kidney injury/dehydration: Creatinine has slightly worsened compared to this morning.  Likely secondary to high volume biliary drain output.  Being evaluated as above. Increased IV fluids to 200 mL/h. Trend daily BMP.  Thrombocytopenia: Improving.  Nausea and vomiting: Improved.  Essential hypertension : Already received this morning's dose of Avapro.  Reassess with CMP in the morning to determine if aortic probe needs to be held for AKI.  Type 2 diabetes: Mildly uncontrolled and fluctuating.  Continue current  SSI.  Hyperlipidemia : Continue statin.    DVT prophylaxis: Lovenox  Code Status: Full code Family Communication: Discussed in detail with patient's wife at bedside, updated care and answered all questions Disposition Plan:   Status is: Inpatient  Remains inpatient appropriate because:Inpatient level of care appropriate due to severity of illness  Dispo: The patient is from: Home              Anticipated d/c is to: Home              Patient currently is not medically stable to d/c.   Difficult to place patient No  Consultants:  GI IR  Procedures:  Antimicrobials:   Anti-infectives (From admission, onward)    Start     Dose/Rate Route Frequency Ordered Stop   07/10/21 0800  cefOXitin (MEFOXIN) 2 g in sodium chloride 0.9 % 100 mL IVPB        2 g 200 mL/hr over 30 Minutes Intravenous To Radiology 07/09/21 1407 07/10/21 1216       Subjective: Patient seen this morning.  Had episode of periumbilical abdominal pain last night which improved with tramadol but since then has progressively worsening pain since my visit, ongoing high volume biliary output.  Reports appetite is good and had BM.  Objective: Vitals:   07/10/21 2149 07/11/21 0021 07/11/21 0348 07/11/21 0840  BP: 107/81 99/74 95/60  111/81  Pulse: 62 64 67 (!) 110  Resp: 17 17 17 18   Temp: 98 F (36.7 C) 98 F (36.7 C) 97.9 F (36.6 C) 98 F (36.7 C)  TempSrc: Oral Oral Oral Oral  SpO2: 96% 92% (!) 89% 90%  Weight:      Height:        Intake/Output Summary (Last 24 hours) at 07/11/2021 1536 Last data filed at 07/11/2021 1300 Gross per 24 hour  Intake 1250.03 ml  Output 5325 ml  Net -4074.97 ml    Filed Weights   07/06/21 1857  Weight: 88.9 kg    Examination:  General exam: Middle-age male, moderately built and nourished lying comfortably propped up in bed without distress.  Scleral and skin icterus. Respiratory system: Clear to auscultation. Respiratory effort normal. Cardiovascular system: S1 &  S2 heard, RRR. No JVD, murmurs, rubs, gallops or clicks. No pedal edema. Gastrointestinal system: Abdomen is nondistended, soft and nontender during my evaluation. No organomegaly or masses felt. Normal bowel sounds heard. Left/mid abdominal percutaneous biliary drain. Central nervous system: Alert and oriented. No focal neurological deficits. Extremities: Symmetric 5 x 5 power. Skin: No rashes, lesions or ulcers Psychiatry: Judgement and insight appear normal. Mood & affect appropriate.      Data Reviewed: I have personally reviewed following labs and imaging studies  CBC: Recent Labs  Lab 07/06/21 1925 07/07/21 0205 07/08/21 0117 07/09/21 0055 07/10/21 0123 07/11/21 0109 07/11/21 1414  WBC 5.2   < > 3.6* 4.0 4.6 7.2 10.4  NEUTROABS 3.8  --   --   --   --   --   --   HGB 14.7   < > 13.9 14.3 14.6 16.1 17.9*  HCT 43.1   < > 41.5 40.9 42.5 47.5 51.7  MCV 96.0   < > 95.4 93.8 94.2 94.8 93.7  PLT 90*   < > 72* 71* 79* 123* 147*   < > = values in this interval not displayed.    Basic Metabolic Panel: Recent Labs  Lab 07/08/21 0117 07/09/21 0055 07/10/21 0123 07/11/21 0109 07/11/21 1414  NA 135 135 137 134* 134*  K 3.5 3.6 3.6 4.1 4.0  CL 103 105 105 102 103  CO2 27 24 25 25  21*  GLUCOSE 186* 152* 136* 174* 131*  BUN 12 10 13 22  30*  CREATININE 0.83 0.74 0.85 1.01 1.26*  CALCIUM 8.2* 8.5* 8.7* 9.4 9.8  MG 2.0 1.9 1.9 2.2  --     GFR: Estimated Creatinine Clearance: 55.3 mL/min (A) (by C-G formula based on SCr of 1.26 mg/dL (H)). Liver Function Tests: Recent Labs  Lab 07/08/21 0117 07/09/21 0055 07/10/21 0123 07/11/21 0109 07/11/21 1414  AST 103* 99* 100* 123* 115*  ALT 125* 116* 107* 126* 125*  ALKPHOS 233* 248* 256* 271* 277*  BILITOT 5.5* 5.2* 5.7* 5.0* 5.2*  PROT 6.5 6.6 6.6 7.5 8.5*  ALBUMIN 2.6* 2.7* 2.7* 3.1* 3.4*    Recent Labs  Lab 07/06/21 1925  LIPASE 51    Recent Labs  Lab 07/06/21 2329  AMMONIA 76*    Coagulation Profile: Recent  Labs  Lab 07/06/21 2329 07/08/21 0117  INR 1.1 1.0    CBG: Recent Labs  Lab 07/10/21 0758 07/10/21 1616 07/10/21 2004 07/11/21 0838 07/11/21 1140  GLUCAP 135* 126* 162* 152* 193*    Recent Results (from the past 240 hour(s))  Resp Panel by RT-PCR (Flu A&B, Covid) Nasopharyngeal Swab     Status: None   Collection Time: 07/06/21  7:15 PM   Specimen: Nasopharyngeal Swab; Nasopharyngeal(NP) swabs in vial transport medium  Result Value Ref Range Status   SARS Coronavirus 2 by RT PCR NEGATIVE NEGATIVE Final    Comment: (NOTE) SARS-CoV-2 target nucleic acids are NOT DETECTED.  The SARS-CoV-2 RNA is generally detectable in upper respiratory specimens during the acute phase of infection. The lowest concentration of SARS-CoV-2 viral copies this assay can detect is 138 copies/mL. A negative result does not preclude SARS-Cov-2 infection and should not be used as the sole basis for treatment or other patient management decisions. A negative result may occur with  improper specimen collection/handling, submission of specimen other than nasopharyngeal swab, presence of viral mutation(s) within the areas targeted by this assay, and inadequate number of viral copies(<138 copies/mL). A negative result must be combined with clinical observations, patient history, and epidemiological information. The expected result is Negative.  Fact Sheet for Patients:  EntrepreneurPulse.com.au  Fact Sheet for Healthcare Providers:  IncredibleEmployment.be  This test is no t yet approved or cleared by the [place] FDA and  has been authorized for detection and/or diagnosis of SARS-CoV-2 by FDA under an Emergency Use Authorization (EUA). This EUA will remain  in effect (meaning this test can be used) for the duration of the COVID-19 declaration under Section 564(b)(1) of the Act, 21 U.S.C.section 360bbb-3(b)(1), unless the authorization is terminated  or  revoked sooner.       Influenza A by PCR NEGATIVE NEGATIVE Final   Influenza B by PCR NEGATIVE NEGATIVE Final    Comment: (NOTE) The Xpert Xpress SARS-CoV-2/FLU/RSV plus assay is intended as an aid in the diagnosis of influenza from Nasopharyngeal swab specimens and should not be used as a sole basis for treatment. Nasal washings and aspirates are unacceptable for Xpert Xpress SARS-CoV-2/FLU/RSV testing.  Fact Sheet for Patients: EntrepreneurPulse.com.au  Fact Sheet for Healthcare Providers: IncredibleEmployment.be  This test is not yet approved or cleared by the [place] FDA and has been authorized for detection and/or diagnosis of SARS-CoV-2 by FDA under an Emergency Use Authorization (EUA). This EUA will remain in effect (meaning this test can be used) for the duration of the COVID-19 declaration  under Section 564(b)(1) of the Act, 21 U.S.C. section 360bbb-3(b)(1), unless the authorization is terminated or revoked.  Performed at [facility], [place] [address], [place],  [address]       Radiology Studies: IR US Guide Bx Asp/Drain  Result Date: 07/10/2021 INDICATION: 70 year old male with evidence of cirrhosis, significant intrahepatic biliary ductal dilatation with biliary occlusion at the intrahepatic hilum, obstructed jaundice and a possible infiltrative mass within hepatic segment 8. Patient presents today for placement a percutaneous biliary drainage catheter, attempted internalization past the central obstruction, brush biopsy and also potentially ultrasound-guided core biopsy of the suspected hepatic parenchymal mass. EXAM: 1. Percutaneous transhepatic cholangiogram 2. Transhepatic brush biopsy of biliary lesion 3. Successful crossing of biliary stenosis and placement of internal/external percutaneous biliary drainage catheter 4. Ultrasound-guided core biopsy of ill-defined masslike region in hepatic segment 8 MEDICATIONS:  2 g cefoxitin; The antibiotic was administered within an appropriate time frame prior to the initiation of the procedure. ANESTHESIA/SEDATION: Moderate (conscious) sedation was employed during this procedure. A total of Versed 4 mg and Fentanyl 200 mcg was administered intravenously. Moderate Sedation Time: 49 minutes. The patient's level of consciousness and vital signs were monitored continuously by radiology nursing throughout the procedure under my direct supervision. FLUOROSCOPY TIME:  Fluoroscopy Time: 12 minutes 24 seconds (143 mGy). COMPLICATIONS: None immediate. PROCEDURE: Informed written consent was obtained from the patient after a thorough discussion of the procedural risks, benefits and alternatives. All questions were addressed. Maximal Sterile Barrier Technique was utilized including caps, mask, sterile gowns, sterile gloves, sterile drape, hand hygiene and skin antiseptic. A timeout was performed prior to the initiation of the procedure. Ultrasound was used to interrogate the left hemi liver. Biliary ductal dilatation is identified. A suitable skin entry site was selected and marked. Local anesthesia was attained by infiltration with 1% lidocaine. A small dermatotomy was made. Using a 21 gauge Chiba needle, the needle was carefully advanced through the left lobe of the liver and used to puncture the dilated bile duct. A transhepatic cholangiogram was then performed. This demonstrates successful access of a tertiary biliary radicle. There is marked intrahepatic biliary ductal dilatation. A large filling defect is present within the hepatic hilum extending into the common hepatic duct. Ultimately, a 0.018 microwire was advanced centrally within the biliary tree. The access needle was removed. The Accustick sheath was advanced over the wire and into the left main hepatic duct. Additional cholangiography was performed. There is no cross-filling into the right intrahepatic ducts. The cystic duct is  patent. The common bile duct is patent. A road runner wire was used to successfully traverse the obstructing mass at the hepatic confluence. The Accustick sheath was then removed. The tract was serially dilated to 10 [place]. A 6 French vascular sheath was advanced over the wire and positioned adjacent to the obstructing mass in the common hepatic duct. Brush biopsies were then obtained. The 6 French sheath was then removed. A 10 [place] biliary drainage catheter was then advanced over the wire, past the occlusion and into the duodenum. The distal loop was reconstituted in the duodenum. Contrast injection confirms adequate placement of the catheter with persistent filling of the dilated intrahepatic left ducts and passage of contrast material into the duodenum. The catheter was secured to the skin with 0 Prolene suture. Attention was next turned to the right upper quadrant. The right upper quadrant was interrogated with ultrasound. A region of heterogeneity in hepatic segment 8 was identified. This appears to correspond with  the suspected mass lesion on prior CT and MRI imaging. Local anesthesia was again attained by infiltration with 1% lidocaine. A small dermatotomy was made. Under real-time ultrasound guidance, a 17 gauge introducer needle was advanced into the liver. Multiple 18 gauge core biopsies were then coaxially obtained using the bio Pince automated biopsy device. Biopsy specimens were placed in formalin and delivered to pathology for further analysis. As the introducer needle was removed, the biopsy tract was embolized with a Gel-Foam slurry. IMPRESSION: 1. Successful percutaneous transhepatic cholangiogram demonstrating an obstructing mass at the intrahepatic confluence extending into the common hepatic duct. 2. Brush biopsy of intra biliary mass. 3. Successful placement of a 10 French internal/external biliary drainage catheter. 4. Ultrasound-guided core biopsy of region of ill-defined heterogeneity in  segment 8 of the liver. PLAN: 1. Maintain internal/external biliary drainage catheter to bag until bilirubin has decreased adequately. Capping may then be attempted. 2. Due to underlying cirrhosis and hepatomegaly, the existing standard internal/external biliary drainage catheter just covers the occluded segment of the biliary tree. In the future, biliary drainage catheters may need to be modified with additional sideholes to provide more broad coverage. Electronically Signed   By: Jacqulynn Cadet M.D.   On: 07/10/2021 15:55   IR INT EXT BILIARY DRAIN WITH CHOLANGIOGRAM  Result Date: 07/10/2021 INDICATION: 71 year old male with evidence of cirrhosis, significant intrahepatic biliary ductal dilatation with biliary occlusion at the intrahepatic hilum, obstructed jaundice and a possible infiltrative mass within hepatic segment 8. Patient presents today for placement a percutaneous biliary drainage catheter, attempted internalization past the central obstruction, brush biopsy and also potentially ultrasound-guided core biopsy of the suspected hepatic parenchymal mass. EXAM: 1. Percutaneous transhepatic cholangiogram 2. Transhepatic brush biopsy of biliary lesion 3. Successful crossing of biliary stenosis and placement of internal/external percutaneous biliary drainage catheter 4. Ultrasound-guided core biopsy of ill-defined masslike region in hepatic segment 8 MEDICATIONS: 2 g cefoxitin; The antibiotic was administered within an appropriate time frame prior to the initiation of the procedure. ANESTHESIA/SEDATION: Moderate (conscious) sedation was employed during this procedure. A total of Versed 4 mg and Fentanyl 200 mcg was administered intravenously. Moderate Sedation Time: 49 minutes. The patient's level of consciousness and vital signs were monitored continuously by radiology nursing throughout the procedure under my direct supervision. FLUOROSCOPY TIME:  Fluoroscopy Time: 12 minutes 24 seconds (143 mGy).  COMPLICATIONS: None immediate. PROCEDURE: Informed written consent was obtained from the patient after a thorough discussion of the procedural risks, benefits and alternatives. All questions were addressed. Maximal Sterile Barrier Technique was utilized including caps, mask, sterile gowns, sterile gloves, sterile drape, hand hygiene and skin antiseptic. A timeout was performed prior to the initiation of the procedure. Ultrasound was used to interrogate the left hemi liver. Biliary ductal dilatation is identified. A suitable skin entry site was selected and marked. Local anesthesia was attained by infiltration with 1% lidocaine. A small dermatotomy was made. Using a 21 gauge Chiba needle, the needle was carefully advanced through the left lobe of the liver and used to puncture the dilated bile duct. A transhepatic cholangiogram was then performed. This demonstrates successful access of a tertiary biliary radicle. There is marked intrahepatic biliary ductal dilatation. A large filling defect is present within the hepatic hilum extending into the common hepatic duct. Ultimately, a 0.018 microwire was advanced centrally within the biliary tree. The access needle was removed. The Accustick sheath was advanced over the wire and into the left main hepatic duct. Additional cholangiography was performed. There is no  cross-filling into the right intrahepatic ducts. The cystic duct is patent. The common bile duct is patent. A road runner wire was used to successfully traverse the obstructing mass at the hepatic confluence. The Accustick sheath was then removed. The tract was serially dilated to 10 [place]. A 6 French vascular sheath was advanced over the wire and positioned adjacent to the obstructing mass in the common hepatic duct. Brush biopsies were then obtained. The 6 French sheath was then removed. A 10 [place] biliary drainage catheter was then advanced over the wire, past the occlusion and into the duodenum. The distal  loop was reconstituted in the duodenum. Contrast injection confirms adequate placement of the catheter with persistent filling of the dilated intrahepatic left ducts and passage of contrast material into the duodenum. The catheter was secured to the skin with 0 Prolene suture. Attention was next turned to the right upper quadrant. The right upper quadrant was interrogated with ultrasound. A region of heterogeneity in hepatic segment 8 was identified. This appears to correspond with the suspected mass lesion on prior CT and MRI imaging. Local anesthesia was again attained by infiltration with 1% lidocaine. A small dermatotomy was made. Under real-time ultrasound guidance, a 17 gauge introducer needle was advanced into the liver. Multiple 18 gauge core biopsies were then coaxially obtained using the bio Pince automated biopsy device. Biopsy specimens were placed in formalin and delivered to pathology for further analysis. As the introducer needle was removed, the biopsy tract was embolized with a Gel-Foam slurry. IMPRESSION: 1. Successful percutaneous transhepatic cholangiogram demonstrating an obstructing mass at the intrahepatic confluence extending into the common hepatic duct. 2. Brush biopsy of intra biliary mass. 3. Successful placement of a 10 French internal/external biliary drainage catheter. 4. Ultrasound-guided core biopsy of region of ill-defined heterogeneity in segment 8 of the liver. PLAN: 1. Maintain internal/external biliary drainage catheter to bag until bilirubin has decreased adequately. Capping may then be attempted. 2. Due to underlying cirrhosis and hepatomegaly, the existing standard internal/external biliary drainage catheter just covers the occluded segment of the biliary tree. In the future, biliary drainage catheters may need to be modified with additional sideholes to provide more broad coverage. Electronically Signed   By: Jacqulynn Cadet M.D.   On: 07/10/2021 15:55   IR ENDOLUMINAL  BX OF BILIARY TREE  Result Date: 07/10/2021 INDICATION: 70 year old male with evidence of cirrhosis, significant intrahepatic biliary ductal dilatation with biliary occlusion at the intrahepatic hilum, obstructed jaundice and a possible infiltrative mass within hepatic segment 8. Patient presents today for placement a percutaneous biliary drainage catheter, attempted internalization past the central obstruction, brush biopsy and also potentially ultrasound-guided core biopsy of the suspected hepatic parenchymal mass. EXAM: 1. Percutaneous transhepatic cholangiogram 2. Transhepatic brush biopsy of biliary lesion 3. Successful crossing of biliary stenosis and placement of internal/external percutaneous biliary drainage catheter 4. Ultrasound-guided core biopsy of ill-defined masslike region in hepatic segment 8 MEDICATIONS: 2 g cefoxitin; The antibiotic was administered within an appropriate time frame prior to the initiation of the procedure. ANESTHESIA/SEDATION: Moderate (conscious) sedation was employed during this procedure. A total of Versed 4 mg and Fentanyl 200 mcg was administered intravenously. Moderate Sedation Time: 49 minutes. The patient's level of consciousness and vital signs were monitored continuously by radiology nursing throughout the procedure under my direct supervision. FLUOROSCOPY TIME:  Fluoroscopy Time: 12 minutes 24 seconds (143 mGy). COMPLICATIONS: None immediate. PROCEDURE: Informed written consent was obtained from the patient after a thorough discussion of the procedural risks, benefits and  alternatives. All questions were addressed. Maximal Sterile Barrier Technique was utilized including caps, mask, sterile gowns, sterile gloves, sterile drape, hand hygiene and skin antiseptic. A timeout was performed prior to the initiation of the procedure. Ultrasound was used to interrogate the left hemi liver. Biliary ductal dilatation is identified. A suitable skin entry site was selected and  marked. Local anesthesia was attained by infiltration with 1% lidocaine. A small dermatotomy was made. Using a 21 gauge Chiba needle, the needle was carefully advanced through the left lobe of the liver and used to puncture the dilated bile duct. A transhepatic cholangiogram was then performed. This demonstrates successful access of a tertiary biliary radicle. There is marked intrahepatic biliary ductal dilatation. A large filling defect is present within the hepatic hilum extending into the common hepatic duct. Ultimately, a 0.018 microwire was advanced centrally within the biliary tree. The access needle was removed. The Accustick sheath was advanced over the wire and into the left main hepatic duct. Additional cholangiography was performed. There is no cross-filling into the right intrahepatic ducts. The cystic duct is patent. The common bile duct is patent. A road runner wire was used to successfully traverse the obstructing mass at the hepatic confluence. The Accustick sheath was then removed. The tract was serially dilated to 10 [place]. A 6 French vascular sheath was advanced over the wire and positioned adjacent to the obstructing mass in the common hepatic duct. Brush biopsies were then obtained. The 6 French sheath was then removed. A 10 [place] biliary drainage catheter was then advanced over the wire, past the occlusion and into the duodenum. The distal loop was reconstituted in the duodenum. Contrast injection confirms adequate placement of the catheter with persistent filling of the dilated intrahepatic left ducts and passage of contrast material into the duodenum. The catheter was secured to the skin with 0 Prolene suture. Attention was next turned to the right upper quadrant. The right upper quadrant was interrogated with ultrasound. A region of heterogeneity in hepatic segment 8 was identified. This appears to correspond with the suspected mass lesion on prior CT and MRI imaging. Local anesthesia was  again attained by infiltration with 1% lidocaine. A small dermatotomy was made. Under real-time ultrasound guidance, a 17 gauge introducer needle was advanced into the liver. Multiple 18 gauge core biopsies were then coaxially obtained using the bio Pince automated biopsy device. Biopsy specimens were placed in formalin and delivered to pathology for further analysis. As the introducer needle was removed, the biopsy tract was embolized with a Gel-Foam slurry. IMPRESSION: 1. Successful percutaneous transhepatic cholangiogram demonstrating an obstructing mass at the intrahepatic confluence extending into the common hepatic duct. 2. Brush biopsy of intra biliary mass. 3. Successful placement of a 10 French internal/external biliary drainage catheter. 4. Ultrasound-guided core biopsy of region of ill-defined heterogeneity in segment 8 of the liver. PLAN: 1. Maintain internal/external biliary drainage catheter to bag until bilirubin has decreased adequately. Capping may then be attempted. 2. Due to underlying cirrhosis and hepatomegaly, the existing standard internal/external biliary drainage catheter just covers the occluded segment of the biliary tree. In the future, biliary drainage catheters may need to be modified with additional sideholes to provide more broad coverage. Electronically Signed   By: Jacqulynn Cadet M.D.   On: 07/10/2021 15:55    Scheduled Meds:  atorvastatin  40 mg Oral QHS   enoxaparin (LOVENOX) injection  40 mg Subcutaneous Daily   insulin aspart  0-15 Units Subcutaneous TID WC   insulin aspart  0-5 Units Subcutaneous QHS   insulin aspart  2 Units Subcutaneous TID WC   irbesartan  150 mg Oral Daily   ondansetron (ZOFRAN) IV  4 mg Intravenous Q6H   sodium chloride flush  5 mL Intracatheter Q8H   Continuous Infusions:    LOS: 4 days    Time spent: 25 mins  Vernell Leep, MD, [place], [facility]. [facility]  To contact the attending provider between 7A-7P or the covering  provider during after hours 7P-7A, please log into the web site [email or web address] and access using universal [place] password for that web site. If you do not have the password, please call the hospital operator.

## 2021-07-11 NOTE — Progress Notes (Signed)
Referring Physician(s): Dr. 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 Laurence Ferrari  Subjective: Patient in bed, two family members in the room. Patient is complaining of "[id number]" periumbilical pain that he describes as intense aching. Drain output is close to 5 liters since drain was placed.   Allergies: Patient has no known allergies.  Medications: Prior to Admission medications   Medication Sig Start Date End Date Taking? Authorizing Provider  atorvastatin (LIPITOR) 40 MG tablet Take 40 mg by mouth at bedtime. 03/22/16  Yes [provider]  irbesartan (AVAPRO) 150 MG tablet Take 150 mg by mouth daily. 05/07/21  Yes [provider]  metFORMIN (GLUCOPHAGE) 1000 MG tablet Take 1,000 mg by mouth 2 (two) times daily with a meal. 06/21/21  Yes [provider]  RYBELSUS 7 MG TABS Take 7 mg by mouth every morning. 06/25/21  Yes [provider]     Vital Signs: BP 111/81 (BP Location: Left Arm)   Pulse (!) 110   Temp 98 F (36.7 C) (Oral)   Resp 18   Ht '5\' 3"'$  (1.6 m)   Wt 196 lb (88.9 kg)   SpO2 90%   BMI 34.72 kg/m   Physical Exam Constitutional:      General: He is not in acute distress.    Appearance: He is not ill-appearing.     Comments: Patient appears mildly restless and uncomfortable in bed.   Pulmonary:     Effort: Pulmonary effort is normal.  Abdominal:     Tenderness: There is abdominal tenderness.     Comments: Left/mid abdominal internal/external biliary drain to gravity. Approximately 200 ml of dark orange fluid in bag. Drain easily flushed and aspirated. Skin insertion site is unremarkable; suture and stat-lock intact. Dressing  is clean and dry.   Skin:    General: Skin is warm and dry.  Neurological:     Mental Status: He is alert and oriented to person, place, and time.    Imaging: IR US Guide Bx Asp/Drain  Result Date: 07/10/2021 INDICATION: 70 year old male with evidence of cirrhosis, significant intrahepatic biliary ductal dilatation with biliary occlusion at the intrahepatic hilum, obstructed jaundice and a possible infiltrative mass within hepatic segment 8. Patient presents today for placement a percutaneous biliary drainage catheter, attempted internalization past the central obstruction, brush biopsy and also potentially ultrasound-guided core biopsy of the suspected hepatic parenchymal mass. EXAM: 1. Percutaneous transhepatic cholangiogram 2. Transhepatic brush biopsy of biliary lesion 3. Successful crossing of biliary stenosis and placement of internal/external percutaneous biliary drainage catheter 4. Ultrasound-guided core biopsy of ill-defined masslike region in hepatic segment 8 MEDICATIONS: 2 g cefoxitin; The antibiotic was administered within an appropriate time frame prior to the initiation of the procedure. ANESTHESIA/SEDATION: Moderate (conscious) sedation was employed during this procedure. A total of Versed 4 mg and Fentanyl 200 mcg was administered intravenously. Moderate Sedation Time: 49 minutes. The patient's level of consciousness and vital signs were monitored continuously by radiology nursing throughout the procedure under my direct supervision. FLUOROSCOPY TIME:  Fluoroscopy Time: 12 minutes 24 seconds (143 mGy). COMPLICATIONS: None immediate. PROCEDURE: Informed written consent was obtained from the patient after a thorough discussion of the procedural risks, benefits and alternatives. All questions were addressed. Maximal Sterile  Barrier Technique was utilized including caps, mask, sterile gowns, sterile gloves, sterile drape, hand hygiene and skin antiseptic. A timeout was performed prior to the  initiation of the procedure. Ultrasound was used to interrogate the left hemi liver. Biliary ductal dilatation is identified. A suitable skin entry site was selected and marked. Local anesthesia was attained by infiltration with 1% lidocaine. A small dermatotomy was made. Using a 21 gauge Chiba needle, the needle was carefully advanced through the left lobe of the liver and used to puncture the dilated bile duct. A transhepatic cholangiogram was then performed. This demonstrates successful access of a tertiary biliary radicle. There is marked intrahepatic biliary ductal dilatation. A large filling defect is present within the hepatic hilum extending into the common hepatic duct. Ultimately, a 0.018 microwire was advanced centrally within the biliary tree. The access needle was removed. The Accustick sheath was advanced over the wire and into the left main hepatic duct. Additional cholangiography was performed. There is no cross-filling into the right intrahepatic ducts. The cystic duct is patent. The common bile duct is patent. A road runner wire was used to successfully traverse the obstructing mass at the hepatic confluence. The Accustick sheath was then removed. The tract was serially dilated to 10 [place]. A 6 French vascular sheath was advanced over the wire and positioned adjacent to the obstructing mass in the common hepatic duct. Brush biopsies were then obtained. The 6 French sheath was then removed. A 10 [place] biliary drainage catheter was then advanced over the wire, past the occlusion and into the duodenum. The distal loop was reconstituted in the duodenum. Contrast injection confirms adequate placement of the catheter with persistent filling of the dilated intrahepatic left ducts and passage of contrast material into the duodenum. The catheter was secured to the skin with 0 Prolene suture. Attention was next turned to the right upper quadrant. The right upper quadrant was interrogated with ultrasound. A  region of heterogeneity in hepatic segment 8 was identified. This appears to correspond with the suspected mass lesion on prior CT and MRI imaging. Local anesthesia was again attained by infiltration with 1% lidocaine. A small dermatotomy was made. Under real-time ultrasound guidance, a 17 gauge introducer needle was advanced into the liver. Multiple 18 gauge core biopsies were then coaxially obtained using the bio Pince automated biopsy device. Biopsy specimens were placed in formalin and delivered to pathology for further analysis. As the introducer needle was removed, the biopsy tract was embolized with a Gel-Foam slurry. IMPRESSION: 1. Successful percutaneous transhepatic cholangiogram demonstrating an obstructing mass at the intrahepatic confluence extending into the common hepatic duct. 2. Brush biopsy of intra biliary mass. 3. Successful placement of a 10 French internal/external biliary drainage catheter. 4. Ultrasound-guided core biopsy of region of ill-defined heterogeneity in segment 8 of the liver. PLAN: 1. Maintain internal/external biliary drainage catheter to bag until bilirubin has decreased adequately. Capping may then be attempted. 2. Due to underlying cirrhosis and hepatomegaly, the existing standard internal/external biliary drainage catheter just covers the occluded segment of the biliary tree. In the future, biliary drainage catheters may need to be modified with additional sideholes to provide more broad coverage. Electronically Signed   By: Jacqulynn Cadet M.D.   On: 07/10/2021 15:55   IR INT EXT BILIARY DRAIN WITH CHOLANGIOGRAM  Result Date: 07/10/2021 INDICATION: 70 year old male with evidence of cirrhosis, significant intrahepatic biliary ductal dilatation with biliary occlusion at the intrahepatic hilum, obstructed jaundice and a possible infiltrative mass within hepatic segment 8.  Patient presents today for placement a percutaneous biliary drainage catheter, attempted  internalization past the central obstruction, brush biopsy and also potentially ultrasound-guided core biopsy of the suspected hepatic parenchymal mass. EXAM: 1. Percutaneous transhepatic cholangiogram 2. Transhepatic brush biopsy of biliary lesion 3. Successful crossing of biliary stenosis and placement of internal/external percutaneous biliary drainage catheter 4. Ultrasound-guided core biopsy of ill-defined masslike region in hepatic segment 8 MEDICATIONS: 2 g cefoxitin; The antibiotic was administered within an appropriate time frame prior to the initiation of the procedure. ANESTHESIA/SEDATION: Moderate (conscious) sedation was employed during this procedure. A total of Versed 4 mg and Fentanyl 200 mcg was administered intravenously. Moderate Sedation Time: 49 minutes. The patient's level of consciousness and vital signs were monitored continuously by radiology nursing throughout the procedure under my direct supervision. FLUOROSCOPY TIME:  Fluoroscopy Time: 12 minutes 24 seconds (143 mGy). COMPLICATIONS: None immediate. PROCEDURE: Informed written consent was obtained from the patient after a thorough discussion of the procedural risks, benefits and alternatives. All questions were addressed. Maximal Sterile Barrier Technique was utilized including caps, mask, sterile gowns, sterile gloves, sterile drape, hand hygiene and skin antiseptic. A timeout was performed prior to the initiation of the procedure. Ultrasound was used to interrogate the left hemi liver. Biliary ductal dilatation is identified. A suitable skin entry site was selected and marked. Local anesthesia was attained by infiltration with 1% lidocaine. A small dermatotomy was made. Using a 21 gauge Chiba needle, the needle was carefully advanced through the left lobe of the liver and used to puncture the dilated bile duct. A transhepatic cholangiogram was then performed. This demonstrates successful access of a tertiary biliary radicle. There is  marked intrahepatic biliary ductal dilatation. A large filling defect is present within the hepatic hilum extending into the common hepatic duct. Ultimately, a 0.018 microwire was advanced centrally within the biliary tree. The access needle was removed. The Accustick sheath was advanced over the wire and into the left main hepatic duct. Additional cholangiography was performed. There is no cross-filling into the right intrahepatic ducts. The cystic duct is patent. The common bile duct is patent. A road runner wire was used to successfully traverse the obstructing mass at the hepatic confluence. The Accustick sheath was then removed. The tract was serially dilated to 10 [place]. A 6 French vascular sheath was advanced over the wire and positioned adjacent to the obstructing mass in the common hepatic duct. Brush biopsies were then obtained. The 6 French sheath was then removed. A 10 [place] biliary drainage catheter was then advanced over the wire, past the occlusion and into the duodenum. The distal loop was reconstituted in the duodenum. Contrast injection confirms adequate placement of the catheter with persistent filling of the dilated intrahepatic left ducts and passage of contrast material into the duodenum. The catheter was secured to the skin with 0 Prolene suture. Attention was next turned to the right upper quadrant. The right upper quadrant was interrogated with ultrasound. A region of heterogeneity in hepatic segment 8 was identified. This appears to correspond with the suspected mass lesion on prior CT and MRI imaging. Local anesthesia was again attained by infiltration with 1% lidocaine. A small dermatotomy was made. Under real-time ultrasound guidance, a 17 gauge introducer needle was advanced into the liver. Multiple 18 gauge core biopsies were then coaxially obtained using the bio Pince automated biopsy device. Biopsy specimens were placed in formalin and delivered to pathology for further analysis.  As the introducer needle was removed, the biopsy tract  was embolized with a Gel-Foam slurry. IMPRESSION: 1. Successful percutaneous transhepatic cholangiogram demonstrating an obstructing mass at the intrahepatic confluence extending into the common hepatic duct. 2. Brush biopsy of intra biliary mass. 3. Successful placement of a 10 French internal/external biliary drainage catheter. 4. Ultrasound-guided core biopsy of region of ill-defined heterogeneity in segment 8 of the liver. PLAN: 1. Maintain internal/external biliary drainage catheter to bag until bilirubin has decreased adequately. Capping may then be attempted. 2. Due to underlying cirrhosis and hepatomegaly, the existing standard internal/external biliary drainage catheter just covers the occluded segment of the biliary tree. In the future, biliary drainage catheters may need to be modified with additional sideholes to provide more broad coverage. Electronically Signed   By: Jacqulynn Cadet M.D.   On: 07/10/2021 15:55   IR ENDOLUMINAL BX OF BILIARY TREE  Result Date: 07/10/2021 INDICATION: 70 year old male with evidence of cirrhosis, significant intrahepatic biliary ductal dilatation with biliary occlusion at the intrahepatic hilum, obstructed jaundice and a possible infiltrative mass within hepatic segment 8. Patient presents today for placement a percutaneous biliary drainage catheter, attempted internalization past the central obstruction, brush biopsy and also potentially ultrasound-guided core biopsy of the suspected hepatic parenchymal mass. EXAM: 1. Percutaneous transhepatic cholangiogram 2. Transhepatic brush biopsy of biliary lesion 3. Successful crossing of biliary stenosis and placement of internal/external percutaneous biliary drainage catheter 4. Ultrasound-guided core biopsy of ill-defined masslike region in hepatic segment 8 MEDICATIONS: 2 g cefoxitin; The antibiotic was administered within an appropriate time frame prior to the  initiation of the procedure. ANESTHESIA/SEDATION: Moderate (conscious) sedation was employed during this procedure. A total of Versed 4 mg and Fentanyl 200 mcg was administered intravenously. Moderate Sedation Time: 49 minutes. The patient's level of consciousness and vital signs were monitored continuously by radiology nursing throughout the procedure under my direct supervision. FLUOROSCOPY TIME:  Fluoroscopy Time: 12 minutes 24 seconds (143 mGy). COMPLICATIONS: None immediate. PROCEDURE: Informed written consent was obtained from the patient after a thorough discussion of the procedural risks, benefits and alternatives. All questions were addressed. Maximal Sterile Barrier Technique was utilized including caps, mask, sterile gowns, sterile gloves, sterile drape, hand hygiene and skin antiseptic. A timeout was performed prior to the initiation of the procedure. Ultrasound was used to interrogate the left hemi liver. Biliary ductal dilatation is identified. A suitable skin entry site was selected and marked. Local anesthesia was attained by infiltration with 1% lidocaine. A small dermatotomy was made. Using a 21 gauge Chiba needle, the needle was carefully advanced through the left lobe of the liver and used to puncture the dilated bile duct. A transhepatic cholangiogram was then performed. This demonstrates successful access of a tertiary biliary radicle. There is marked intrahepatic biliary ductal dilatation. A large filling defect is present within the hepatic hilum extending into the common hepatic duct. Ultimately, a 0.018 microwire was advanced centrally within the biliary tree. The access needle was removed. The Accustick sheath was advanced over the wire and into the left main hepatic duct. Additional cholangiography was performed. There is no cross-filling into the right intrahepatic ducts. The cystic duct is patent. The common bile duct is patent. A road runner wire was used to successfully traverse the  obstructing mass at the hepatic confluence. The Accustick sheath was then removed. The tract was serially dilated to 10 [place]. A 6 French vascular sheath was advanced over the wire and positioned adjacent to the obstructing mass in the common hepatic duct. Brush biopsies were then obtained. The 6 [place] sheath  was then removed. A 10 [place] biliary drainage catheter was then advanced over the wire, past the occlusion and into the duodenum. The distal loop was reconstituted in the duodenum. Contrast injection confirms adequate placement of the catheter with persistent filling of the dilated intrahepatic left ducts and passage of contrast material into the duodenum. The catheter was secured to the skin with 0 Prolene suture. Attention was next turned to the right upper quadrant. The right upper quadrant was interrogated with ultrasound. A region of heterogeneity in hepatic segment 8 was identified. This appears to correspond with the suspected mass lesion on prior CT and MRI imaging. Local anesthesia was again attained by infiltration with 1% lidocaine. A small dermatotomy was made. Under real-time ultrasound guidance, a 17 gauge introducer needle was advanced into the liver. Multiple 18 gauge core biopsies were then coaxially obtained using the bio Pince automated biopsy device. Biopsy specimens were placed in formalin and delivered to pathology for further analysis. As the introducer needle was removed, the biopsy tract was embolized with a Gel-Foam slurry. IMPRESSION: 1. Successful percutaneous transhepatic cholangiogram demonstrating an obstructing mass at the intrahepatic confluence extending into the common hepatic duct. 2. Brush biopsy of intra biliary mass. 3. Successful placement of a 10 French internal/external biliary drainage catheter. 4. Ultrasound-guided core biopsy of region of ill-defined heterogeneity in segment 8 of the liver. PLAN: 1. Maintain internal/external biliary drainage catheter to bag until  bilirubin has decreased adequately. Capping may then be attempted. 2. Due to underlying cirrhosis and hepatomegaly, the existing standard internal/external biliary drainage catheter just covers the occluded segment of the biliary tree. In the future, biliary drainage catheters may need to be modified with additional sideholes to provide more broad coverage. Electronically Signed   By: Jacqulynn Cadet M.D.   On: 07/10/2021 15:55    Labs:  CBC: Recent Labs    07/08/21 0117 07/09/21 0055 07/10/21 0123 07/11/21 0109  WBC 3.6* 4.0 4.6 7.2  HGB 13.9 14.3 14.6 16.1  HCT 41.5 40.9 42.5 47.5  PLT 72* 71* 79* 123*    COAGS: Recent Labs    07/06/21 2329 07/08/21 0117  INR 1.1 1.0  APTT 31  --     BMP: Recent Labs    07/08/21 0117 07/09/21 0055 07/10/21 0123 07/11/21 0109  NA 135 135 137 134*  K 3.5 3.6 3.6 4.1  CL 103 105 105 102  CO2 '27 24 25 25  '$ GLUCOSE 186* 152* 136* 174*  BUN '12 10 13 22  '$ CALCIUM 8.2* 8.5* 8.7* 9.4  CREATININE 0.83 0.74 0.85 1.01  GFRNONAA >60 >60 >60 >60    LIVER FUNCTION TESTS: Recent Labs    07/08/21 0117 07/09/21 0055 07/10/21 0123 07/11/21 0109  BILITOT 5.5* 5.2* 5.7* 5.0*  AST 103* 99* 100* 123*  ALT 125* 116* 107* 126*  ALKPHOS 233* 248* 256* 271*  PROT 6.5 6.6 6.6 7.5  ALBUMIN 2.6* 2.7* 2.7* 3.1*    Assessment and Plan:  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 Laurence Ferrari  Patient is afebrile and without leukocytosis. Hemoglobin is 16.1. T bili has decreased to 5; other LFTs have increased slightly. Per epic, 24 hour drain output is 4450 ml with another 200 ml of dark orange fluid in the gravity bag. Drain easily flushed and aspirated.   Patient states that around 0400  he developed periumbilical pain that has  progressively worsened throughout the day. IR made recommendations to GI and hospitalist team to order repeat CBC, CMP and to obtain hepatic ultrasound for further evaluation. These studies have been ordered by Dr. Tarri Glenn (GI).   Please continue to flush drain and document output each shift. Change the dressing daily or as needed.   Other plans per primary teams - IR will continue to follow.   Electronically Signed: Soyla Dryer, AGACNP-BC [phone] 07/11/2021, 2:27 PM   I spent a total of 35 Minutes at the the patient's bedside AND on the patient's hospital floor or unit, greater than 50% of which was counseling/coordinating care for internal/external biliary drain.

## 2021-07-11 NOTE — Progress Notes (Signed)
Progress Note  Chief Complaint:         ASSESSMENT AND PLAN   # 70 yo male with DM, HTN, obesity.  Admitted 8/20 with abdominal discomfort, weight loss, jaundice. Imaging suggested cirrhosis with portal HTN ( new for patient), biliary duct dilation, cholelithiasis and suspected infiltrative hepatocellular carcinoma.  Dr. Rush Landmark ( one of our advanced endoscopists) reviewed imaging and didn't feel biliary endoscopic placement of stent for decompression would be completely successful based on he characteristics of the lesion. CA 19 -9 417, normal AFP --s/p percutaneous internal / external biliary drain placement, brushings of obstructing intrabiliary lesion, and US guided bx of liver lesion done by IR yesterday.  --Having a lot of output from biliary drain (4 Liters since 7 am). Tbili down slightly overnight from 5.7 to alk phos and liver enzymes up slightly overnight. He is having periumbilical abdominal pain, especially when biliary drainage bag gets full. Tylenol helps but effects are not long lasting.  --We have been in contact with [facility] who has ordered pain meds and increased IV fluids given large biliary drain output. Additionally, [facility] to consult Oncology to see patient or at least arrange for outpatient evaluation. If abdominal pain persists then may need CT scan --Awaiting pathology  DIAGNOSTIC STUDIES  MRI / MRCP w/ and WO contrast IMPRESSION: 1. Moderately motion degraded exam, with limitations above. 2. 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 As above, the extent of motion degradation makes LI-Rads categorization impossible. 3. Upper abdominal adenopathy which could be metastatic or reactive. 4. Portal venous hypertension and splenomegaly. 5. Cholelithiasis   SUBJECTIVE   Having generalized mid abdominal  pain, mainly when drainage bag fills out. Per wife the biliary drainage bag is filling up almost once an hour.        OBJECTIVE      Scheduled inpatient medications:   atorvastatin  40 mg Oral QHS   enoxaparin (LOVENOX) injection  40 mg Subcutaneous Daily   insulin aspart  0-15 Units Subcutaneous TID WC   insulin aspart  0-5 Units Subcutaneous QHS   insulin aspart  2 Units Subcutaneous TID WC   irbesartan  150 mg Oral Daily   ondansetron (ZOFRAN) IV  4 mg Intravenous Q6H   sodium chloride flush  5 mL Intracatheter Q8H   Continuous inpatient infusions:   sodium chloride 75 mL/hr at 07/11/21 0416   PRN inpatient medications: acetaminophen **OR** acetaminophen, LORazepam, ondansetron **OR** ondansetron (ZOFRAN) IV, traMADol  Vital signs in last 24 hours: Temp:  [97.7 F (36.5 C)-98 F (36.7 C)] 98 F (36.7 C) (08/24 0840) Pulse Rate:  [40-110] 110 (08/24 0840) Resp:  [8-23] 18 (08/24 0840) BP: (95-134)/(60-106) 111/81 (08/24 0840) SpO2:  [89 %-98 %] 90 % (08/24 0840) Last BM Date: 07/10/21  Intake/Output Summary (Last 24 hours) at 07/11/2021 1128 Last data filed at 07/11/2021 0842 Gross per 24 hour  Intake 1030.03 ml  Output 4450 ml  Net -3419.97 ml     Physical Exam:  General: Alert male in NAD Heart:  Regular rate and rhythm. No lower extremity edema Pulmonary: Normal respiratory effort Abdomen: Soft, nondistended, nontender. Normal bowel sounds.  Neurologic: Alert and oriented Psych: Pleasant. Cooperative.   Filed Weights   07/06/21 1857  Weight: 88.9 kg    Intake/Output from previous day: 08/23 0701 - 08/24 0700 In: 1030 [P.O.:120; I.V.:900] Out: 4000 [Drains:4000] Intake/Output  this shift: Total I/O In: -  Out: 450 [Drains:450]    Lab Results: Recent Labs    07/09/21 0055 07/10/21 0123 07/11/21 0109  WBC 4.0 4.6 7.2  HGB 14.3 14.6 16.1  HCT 40.9 42.5 47.5  PLT 71* 79* 123*   BMET Recent Labs    07/09/21 0055 07/10/21 0123  07/11/21 0109  NA 135 137 134*  K 3.6 3.6 4.1  CL 105 105 102  CO2 24 25 25   GLUCOSE 152* 136* 174*  BUN 10 13 22   CREATININE 0.74 0.85 1.01  CALCIUM 8.5* 8.7* 9.4   LFT Recent Labs    07/11/21 0109  PROT 7.5  ALBUMIN 3.1*  AST 123*  ALT 126*  ALKPHOS 271*  BILITOT 5.0*   PT/INR No results for input(s): LABPROT, INR in the last 72 hours. Hepatitis Panel No results for input(s): HEPBSAG, HCVAB, HEPAIGM, HEPBIGM in the last 72 hours.  IR US Guide Bx Asp/Drain  Result Date: 07/10/2021 Criselda Peaches, MD     07/10/2021  1:26 PM Interventional Radiology Procedure Note Procedure: 1.) Percutaneous cholagngiogram 2.) Brush biopsy obstructing intrabiliary lesion 3.) Placement of 51F int/ext biliary drain 4.) US guided bx of region of abnormality in hepatic segment 8 Complications: None Estimated Blood Loss: None Recommendations: - Drain to bag until Br has trended down significantly, can then trial capping - Path sent Signed, Criselda Peaches, MD   IR INT EXT BILIARY DRAIN WITH CHOLANGIOGRAM  Result Date: 07/10/2021 Criselda Peaches, MD     07/10/2021  1:26 PM Interventional Radiology Procedure Note Procedure: 1.) Percutaneous cholagngiogram 2.) Brush biopsy obstructing intrabiliary lesion 3.) Placement of 51F int/ext biliary drain 4.) US guided bx of region of abnormality in hepatic segment 8 Complications: None Estimated Blood Loss: None Recommendations: - Drain to bag until Br has trended down significantly, can then trial capping - Path sent Signed, Criselda Peaches, MD   IR ENDOLUMINAL BX OF BILIARY TREE  Result Date: 07/10/2021 Criselda Peaches, MD     07/10/2021  1:26 PM Interventional Radiology Procedure Note Procedure: 1.) Percutaneous cholagngiogram 2.) Brush biopsy obstructing intrabiliary lesion 3.) Placement of 51F int/ext biliary drain 4.) US guided bx of region of abnormality in hepatic segment 8 Complications: None Estimated Blood Loss: None Recommendations:  - Drain to bag until Br has trended down significantly, can then trial capping - Path sent Signed, Criselda Peaches, MD        Principal Problem:   Acquired dilation of bile duct Active Problems:   Benign hypertension   Cirrhosis of liver (HCC)   DM2 (diabetes mellitus, type 2) ([place])   Hepatobiliary tract disease   Liver mass   Jaundice   Elevated CA 19-9 level   Elevated carcinoembryonic antigen (CEA)     LOS: 4 days   Tye Savoy ,NP 07/11/2021, 11:28 AM

## 2021-07-12 DIAGNOSIS — K838 Other specified diseases of biliary tract: Secondary | ICD-10-CM | POA: Diagnosis not present

## 2021-07-12 DIAGNOSIS — R7401 Elevation of levels of liver transaminase levels: Secondary | ICD-10-CM | POA: Diagnosis not present

## 2021-07-12 DIAGNOSIS — R1010 Upper abdominal pain, unspecified: Secondary | ICD-10-CM | POA: Diagnosis not present

## 2021-07-12 DIAGNOSIS — C221 Intrahepatic bile duct carcinoma: Secondary | ICD-10-CM | POA: Diagnosis not present

## 2021-07-12 DIAGNOSIS — K766 Portal hypertension: Secondary | ICD-10-CM

## 2021-07-12 DIAGNOSIS — K7469 Other cirrhosis of liver: Secondary | ICD-10-CM | POA: Diagnosis not present

## 2021-07-12 DIAGNOSIS — K851 Biliary acute pancreatitis without necrosis or infection: Secondary | ICD-10-CM

## 2021-07-12 DIAGNOSIS — K7460 Unspecified cirrhosis of liver: Secondary | ICD-10-CM | POA: Diagnosis not present

## 2021-07-12 DIAGNOSIS — N179 Acute kidney failure, unspecified: Secondary | ICD-10-CM | POA: Diagnosis not present

## 2021-07-12 LAB — MAGNESIUM: Magnesium: 2.5 mg/dL — ABNORMAL HIGH (ref 1.7–2.4)

## 2021-07-12 LAB — GLUCOSE, CAPILLARY
Glucose-Capillary: 153 mg/dL — ABNORMAL HIGH (ref 70–99)
Glucose-Capillary: 165 mg/dL — ABNORMAL HIGH (ref 70–99)
Glucose-Capillary: 116 mg/dL — ABNORMAL HIGH (ref 70–99)
Glucose-Capillary: 188 mg/dL — ABNORMAL HIGH (ref 70–99)

## 2021-07-12 LAB — PROTEIN / CREATININE RATIO, URINE
Creatinine, Urine: 298.8 mg/dL
Protein Creatinine Ratio: 0.21 mg/mg{Cre} — ABNORMAL HIGH (ref 0.00–0.15)
Total Protein, Urine: 62 mg/dL

## 2021-07-12 LAB — CBC
HCT: 52.8 % — ABNORMAL HIGH (ref 39.0–52.0)
Hemoglobin: 18.1 g/dL — ABNORMAL HIGH (ref 13.0–17.0)
MCH: 32.7 pg (ref 26.0–34.0)
MCHC: 34.3 g/dL (ref 30.0–36.0)
MCV: 95.5 fL (ref 80.0–100.0)
Platelets: 149 10*3/uL — ABNORMAL LOW (ref 150–400)
RBC: 5.53 MIL/uL (ref 4.22–5.81)
RDW: 14.7 % (ref 11.5–15.5)
WBC: 11.3 10*3/uL — ABNORMAL HIGH (ref 4.0–10.5)
nRBC: 0 % (ref 0.0–0.2)

## 2021-07-12 LAB — COMPREHENSIVE METABOLIC PANEL
ALT: 117 U/L — ABNORMAL HIGH (ref 0–44)
AST: 100 U/L — ABNORMAL HIGH (ref 15–41)
Albumin: 3.4 g/dL — ABNORMAL LOW (ref 3.5–5.0)
Alkaline Phosphatase: 271 U/L — ABNORMAL HIGH (ref 38–126)
Anion gap: 12 (ref 5–15)
BUN: 37 mg/dL — ABNORMAL HIGH (ref 8–23)
CO2: 23 mmol/L (ref 22–32)
Calcium: 10.1 mg/dL (ref 8.9–10.3)
Chloride: 99 mmol/L (ref 98–111)
Creatinine, Ser: 2.82 mg/dL — ABNORMAL HIGH (ref 0.61–1.24)
GFR, Estimated: 24 mL/min — ABNORMAL LOW (ref >60)
Glucose, Bld: 120 mg/dL — ABNORMAL HIGH (ref 70–99)
Potassium: 5 mmol/L (ref 3.5–5.1)
Sodium: 134 mmol/L — ABNORMAL LOW (ref 135–145)
Total Bilirubin: 4.8 mg/dL — ABNORMAL HIGH (ref 0.3–1.2)
Total Protein: 8.6 g/dL — ABNORMAL HIGH (ref 6.5–8.1)

## 2021-07-12 LAB — LIPASE, BLOOD: Lipase: 1833 U/L — ABNORMAL HIGH (ref 11–51)

## 2021-07-12 MED ORDER — ENOXAPARIN SODIUM 30 MG/0.3ML IJ SOSY
30.0000 mg | PREFILLED_SYRINGE | Freq: Every day | INTRAMUSCULAR | Status: DC
  Administered 2021-07-13 – 2021-07-14 (×2): 30 mg via SUBCUTANEOUS
  Filled 2021-07-12 (×2): qty 0.3

## 2021-07-12 NOTE — Progress Notes (Signed)
PROGRESS NOTE    Phillip Saunders  [id number] DOB: October 29, 1951 DOA: 07/06/2021 PCP: Algis Greenhouse, MD    Brief Narrative:  70 year old male with medical history significant for type 2 diabetes mellitus, hypertension, presented to the ED with complaints of nausea and vomiting x4 days and admitted with painless jaundice and unintentional weight loss.  Found to have cirrhosis with portal hypertension and a large hepatic mass worrisome for malignancy.  ERCP for biliary decompression not likely to be successful for biliary stenting.  IR performed percutaneous cholangiogram with placement of percutaneous biliary drain 8/23.  Postprocedure course complicated by severe abdominal pain and high output biliary drain.  GI and IR reevaluating.  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  Oncology consulted.  Developed AKI, nephrology consulted.  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 CT abdomen shows obstructive pathology likely secondary to malignancy seen on MRI. Elevated tumor markers AFP 5.9, CA 19-9 417, CEA 8.1. Cirrhosis etiology unclear, denies any EtOH intake, NASH, autoimmune. Acute hepatitis panel negative. Ammonia 76,  MRCP: Cirrhosis with concerns of infiltrative hepatocellular carcinoma, right portal vein tumor. [facility] was consulted and indicated that ERCP for biliary decompression not likely to be successful for biliary stenting. IR then performed percutaneous cholangiogram, brush biopsy  of obstructing intrabiliary lesion, placement of 10 French internal/external biliary drain and US guided biopsy of hepatic mass on 07/10/2021 Pathology confirmed adenocarcinoma/cholangiocarcinoma.  Oncology was consulted on 8/24. Postprocedure 8/24, high-volume biliary drain output and progressively worsening abdominal pain.  Likely related to postprocedure acute pancreatitis. Communicated extensively with Dr. Tarri Glenn,  GI and IR.  Abdominal ultrasound without procedure related complications.  IV fluids increased to 200 mL/mL due to high volume drain output. Oncology has consulted and would like to pursue staging work-up including CT of the chest pending improvement in creatinine.  Chemotherapy as outpatient. Drain output has decreased, 1425 thus far today.  Acute pancreatitis: ?  Related to biliary procedure above Lipase 3155 > 1833. Symptomatically much better.  Treat supportively with bowel rest, IV fluids.  Change to full liquids for today.  If does okay then advance to low-fat diet tomorrow  Acute kidney injury/dehydration/hemoconcentration: Creatinine abruptly increased from 1.26-2.8 Secondary to high-volume drain output in the setting of soft blood pressures, ARB use. Nephrology consulted.  ARB discontinued.  IV fluids reduced to 150 mL/h.  Avoid hypotension. Follow BMP in AM.  Thrombocytopenia: Improving.  Nausea and vomiting: Improved.  Essential hypertension : Meant to review ARB use this morning but missed and he had already received his morning dose.  ARB discontinued.  Avoid hypotension.  Type 2 diabetes: Mildly uncontrolled and fluctuating.  Continue current SSI.  Metformin on hold.  Hyperlipidemia : Continue statin.  Cirrhosis: New diagnosis.  GI on board.  DVT prophylaxis: Lovenox Code Status: Full code Family Communication: Discussed in detail with patient's wife at bedside today, updated care and answered all questions Disposition Plan:   Status is:  Inpatient  Remains inpatient appropriate because:Inpatient level of care appropriate due to severity of illness  Dispo:  The patient is from: Home              Anticipated d/c is to: Home              Patient currently is not medically stable to d/c.   Difficult to place patient No  Consultants:  GI IR Nephrology Oncology  Procedures:  Antimicrobials:   Anti-infectives (From admission, onward)    Start     Dose/Rate Route Frequency Ordered Stop   07/10/21 0800  cefOXitin (MEFOXIN) 2 g in sodium chloride 0.9 % 100 mL IVPB        2 g 200 mL/hr over 30 Minutes Intravenous To Radiology 07/09/21 1407 07/10/21 1216       Subjective: Approximately 3 AM today, like a "switch turned off", abdominal pain resolved and has not had any since.  No nausea or vomiting.  Tolerating diet.  Ambulating well.  Shaved this morning.  As per patient and spouse, drain output has also decreased today  Objective: Vitals:   07/11/21 1959 07/12/21 0433 07/12/21 0732 07/12/21 1155  BP: 97/75 94/74 113/81 106/78  Pulse: 73 63 74 71  Resp: '16 16 17 18  '$ Temp: 97.8 F (36.6 C) 98.1 F (36.7 C) 98.1 F (36.7 C) 98.1 F (36.7 C)  TempSrc: Oral Oral Oral Oral  SpO2: 91% 93% 93% 93%  Weight:      Height:        Intake/Output Summary (Last 24 hours) at 07/12/2021 1557 Last data filed at 07/12/2021 1500 Gross per 24 hour  Intake 4206.27 ml  Output 5325 ml  Net -1118.73 ml   Filed Weights   07/06/21 1857  Weight: 88.9 kg    Examination:  General exam: Middle-age male, moderately built and nourished lying comfortably propped up in bed without distress.  Scleral and skin icterus. Respiratory system: Clear to auscultation.  No increased work of breathing. Cardiovascular system: S1 & S2 heard, RRR. No JVD, murmurs, rubs, gallops or clicks. No pedal edema. Gastrointestinal system: Abdomen is nondistended, soft and nontender.  No organomegaly or masses appreciated.  Normal bowel sounds heard.   Percutaneous biliary drain site intact Central nervous system: Alert and oriented. No focal neurological deficits. Extremities: Symmetric 5 x 5 power. Skin: No rashes, lesions or ulcers Psychiatry: Judgement and insight appear normal. Mood & affect appropriate.      Data Reviewed: I have personally reviewed following labs and imaging studies  CBC: Recent Labs  Lab 07/06/21 1925 07/07/21 0205 07/09/21 0055 07/10/21 0123 07/11/21 0109 07/11/21 1414 07/12/21 0138  WBC 5.2   < > 4.0 4.6 7.2 10.4 11.3*  NEUTROABS 3.8  --   --   --   --   --   --   HGB 14.7   < > 14.3 14.6 16.1 17.9* 18.1*  HCT 43.1   < > 40.9 42.5 47.5 51.7 52.8*  MCV 96.0   < > 93.8 94.2 94.8 93.7 95.5  PLT 90*   < > 71* 79* 123* 147* 149*   < > = values in this interval not displayed.   Basic Metabolic Panel: Recent Labs  Lab 07/08/21 0117 07/09/21 0055 07/10/21 0123 07/11/21 0109 07/11/21 1414 07/12/21 0138  NA 135 135 137 134* 134* 134*  K 3.5 3.6 3.6 4.1 4.0 5.0  CL 103 105 105 102 103 99  CO2 '27 24 25 25 '$ 21* 23  GLUCOSE 186* 152* 136* 174* 131* 120*  BUN '12 10 13 22 '$ 30* 37*  CREATININE  0.83 0.74 0.85 1.01 1.26* 2.82*  CALCIUM 8.2* 8.5* 8.7* 9.4 9.8 10.1  MG 2.0 1.9 1.9 2.2  --  2.5*   GFR: Estimated Creatinine Clearance: 24.7 mL/min (A) (by C-G formula based on SCr of 2.82 mg/dL (H)). Liver Function Tests: Recent Labs  Lab 07/09/21 0055 07/10/21 0123 07/11/21 0109 07/11/21 1414 07/12/21 0138  AST 99* 100* 123* 115* 100*  ALT 116* 107* 126* 125* 117*  ALKPHOS 248* 256* 271* 277* 271*  BILITOT 5.2* 5.7* 5.0* 5.2* 4.8*  PROT 6.6 6.6 7.5 8.5* 8.6*  ALBUMIN 2.7* 2.7* 3.1* 3.4* 3.4*   Recent Labs  Lab 07/06/21 1925 07/11/21 1414 07/12/21 0138  LIPASE 51 3,155* 1,833*   Recent Labs  Lab 07/06/21 2329  AMMONIA 76*   Coagulation Profile: Recent Labs  Lab 07/06/21 2329 07/08/21 0117  INR 1.1 1.0   CBG: Recent Labs  Lab 07/11/21 1140 07/11/21 1657 07/11/21 1959  07/12/21 0728 07/12/21 1152  GLUCAP 193* 154* 184* 153* 165*   Recent Results (from the past 240 hour(s))  Resp Panel by RT-PCR (Flu A&B, Covid) Nasopharyngeal Swab     Status: None   Collection Time: 07/06/21  7:15 PM   Specimen: Nasopharyngeal Swab; Nasopharyngeal(NP) swabs in vial transport medium  Result Value Ref Range Status   SARS Coronavirus 2 by RT PCR NEGATIVE NEGATIVE Final    Comment: (NOTE) SARS-CoV-2 target nucleic acids are NOT DETECTED.  The SARS-CoV-2 RNA is generally detectable in upper respiratory specimens during the acute phase of infection. The lowest concentration of SARS-CoV-2 viral copies this assay can detect is 138 copies/mL. A negative result does not preclude SARS-Cov-2 infection and should not be used as the sole basis for treatment or other patient management decisions. A negative result may occur with  improper specimen collection/handling, submission of specimen other than nasopharyngeal swab, presence of viral mutation(s) within the areas targeted by this assay, and inadequate number of viral copies(<138 copies/mL). A negative result must be combined with clinical observations, patient history, and epidemiological information. The expected result is Negative.  Fact Sheet for Patients:  EntrepreneurPulse.com.au  Fact Sheet for Healthcare Providers:  IncredibleEmployment.be  This test is no t yet approved or cleared by the [place] FDA and  has been authorized for detection and/or diagnosis of SARS-CoV-2 by FDA under an Emergency Use Authorization (EUA). This EUA will remain  in effect (meaning this test can be used) for the duration of the COVID-19 declaration under Section 564(b)(1) of the Act, 21 U.S.C.section 360bbb-3(b)(1), unless the authorization is terminated  or revoked sooner.       Influenza A by PCR NEGATIVE NEGATIVE Final   Influenza B by PCR NEGATIVE NEGATIVE Final    Comment:  (NOTE) The Xpert Xpress SARS-CoV-2/FLU/RSV plus assay is intended as an aid in the diagnosis of influenza from Nasopharyngeal swab specimens and should not be used as a sole basis for treatment. Nasal washings and aspirates are unacceptable for Xpert Xpress SARS-CoV-2/FLU/RSV testing.  Fact Sheet for Patients: EntrepreneurPulse.com.au  Fact Sheet for Healthcare Providers: IncredibleEmployment.be  This test is not yet approved or cleared by the [place] FDA and has been authorized for detection and/or diagnosis of SARS-CoV-2 by FDA under an Emergency Use Authorization (EUA). This EUA will remain in effect (meaning this test can be used) for the duration of the COVID-19 declaration under Section 564(b)(1) of the Act, 21 U.S.C. section 360bbb-3(b)(1), unless the authorization is terminated or revoked.  Performed at [facility], [place]  [address] [place], [place] [address]       Radiology Studies: US Abdomen Limited RUQ (LIVER/GB)  Result Date: 07/11/2021 CLINICAL DATA:  70 year old male with history of cirrhosis and recently diagnosed infiltrative hepatic mass, postprocedure day #1 after left-sided percutaneous internal external biliary drain, now with diffuse severe abdominal pain. EXAM: ULTRASOUND ABDOMEN LIMITED RIGHT UPPER QUADRANT COMPARISON:  07/10/2021, 07/07/2021 FINDINGS: Gallbladder: Scattered echogenic gallstones, the largest measuring up to 8 mm. No gallbladder distension, significant wall thickening, or pericholecystic fluid. No sonographic Percell Miller sign reported. Common bile duct: Not visualized. Liver: Mild diffuse intrahepatic biliary ductal dilation, equal in the right and left lobes. The known hepatic mass is not well appreciated sonographically. Heterogeneous hepatic parenchyma with nodular contour. Portal vein is patent on color Doppler imaging with normal direction of blood flow towards the liver. Other: No perihepatic  ascites. IMPRESSION: 1. No acute right upper quadrant abnormality. 2. Similar to slightly decreased mild diffuse intrahepatic biliary ductal dilation from MRI comparison after left-sided biliary drain placement. 3. The known infiltrative hepatic mass in segment 8 is not well demonstrated sonographically. 4. Cholelithiasis. Ruthann Cancer, MD [facility] Radiology Electronically Signed   By: Ruthann Cancer M.D.   On: 07/11/2021 16:33    Scheduled Meds:  atorvastatin  40 mg Oral QHS   [START ON 07/13/2021] enoxaparin (LOVENOX) injection  30 mg Subcutaneous Daily   insulin aspart  0-15 Units Subcutaneous TID WC   insulin aspart  0-5 Units Subcutaneous QHS   insulin aspart  2 Units Subcutaneous TID WC   ondansetron (ZOFRAN) IV  4 mg Intravenous Q6H   sodium chloride flush  5 mL Intracatheter Q8H   Continuous Infusions:    LOS: 5 days    Time spent: 25 mins  Vernell Leep, MD, [place], [facility]. [facility]  To contact the attending provider between 7A-7P or the covering provider during after hours 7P-7A, please log into the web site [email or web address] and access using universal [place] password for that web site. If you do not have the password, please call the hospital operator.

## 2021-07-12 NOTE — Consult Note (Addendum)
[place]  Telephone:([phone]) [phone] Fax:(336) [phone]   MEDICAL ONCOLOGY - INITIAL CONSULTATION  Referral MD: Dr. 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  Scented to the emergency department with jaundice.  He noticed jaundice on the morning of admission and sought care in the emergency department for further evaluation.  Admission lab work showed a platelet count of 90,000, sodium 131, albumin 3.1, AST 126, ALT 149, alk phos 229, T bili 7.0.  CT of the abdomen/pelvis with contrast showed liver cirrhosis, interim development of moderate intrahepatic biliary dilatation, poorly visible extrahepatic common bile duct which does not appear dilated at the head of the pancreas and findings raise concern for hilar obstructing process though no gross mass seen on CT, mild porta hepatis adenopathy indeterminate for metastatic disease, splenomegaly and findings suggestive of portal hypertension.  MRCP performed on 07/07/2021 showed marked cirrhosis with findings highly suspicious for infiltrative HCC resulting in right portal vein tumor involvement and diffuse intrahepatic biliary duct dilatation, upper abdominal adenopathy which could be metastatic or reactive, portal venous hypertension and splenomegaly, cholelithiasis.  IR performed percutaneous cholangiogram with placement of percutaneous biliary drain on 8/23.  Course has been complicated by severe abdominal pain and high output by the biliary drain.  Surgical pathology consistent with adenocarcinoma-cholangiocarcinoma.  Since biliary drain placement, T bili is down to 4.8 this morning.  However, creatinine up to 2.82 today.  I/O's reviewed which indicate over 5 L out from biliary drain on 8/24.  I met with Phillip Saunders and his wife in his hospital room today.  He reports that he is starting to feel better.  He is hopeful to  go home soon.  His grandson's 10th birthday is tomorrow and he is hoping to be home for that.  He reported significant abdominal pain yesterday he had a lot of output through his biliary drain.  However, last evening around 8 PM the abdominal pain stopped.  The output through the biliary drain has decreased today. The patient tells me that overall he has been feeling well at home.  He does report a slight decrease in appetite and a 20 pound weight loss.  He did not think much of it however.  He has not really had any other symptoms and denies abdominal pain, nausea, vomiting, constipation, diarrhea.  Other than jaundice, he has not noticed really any other symptoms.  He denies headaches, dizziness, chest pain, shortness of breath, lower extremity edema, bleeding.  The patient is married.  He has 2 children.  Denies history of alcohol tobacco use.  Family history significant for a mother with breast cancer.  Medical oncology was asked to see the patient to make recommendations regarding his newly diagnosed cholangiocarcinoma.   Past Medical History:  Diagnosis Date   DM2 (diabetes mellitus, type 2) ([place])    Hypertension    Primary hyperparathyroidism ([place])   :   Past Surgical History:  Procedure Laterality Date   APPENDECTOMY     HERNIA REPAIR     IR ENDOLUMINAL BX OF BILIARY TREE  07/10/2021   IR INT EXT BILIARY DRAIN WITH CHOLANGIOGRAM  07/10/2021   IR US GUIDE BX ASP/DRAIN  07/10/2021   PARATHYROIDECTOMY     VASECTOMY    :   Current Facility-Administered Medications  Medication Dose Route Frequency Provider Last Rate Last Admin   0.9 %  sodium chloride infusion   Intravenous Continuous  Modena Jansky, MD 200 mL/hr at 07/12/21 5643 Infusion Verify at 07/12/21 3295   acetaminophen (TYLENOL) tablet 650 mg  650 mg Oral Q6H PRN Etta Quill, DO   650 mg at 07/11/21 1322   Or   acetaminophen (TYLENOL) suppository 650 mg  650 mg Rectal Q6H PRN Etta Quill, DO       atorvastatin  (LIPITOR) tablet 40 mg  40 mg Oral QHS Jennette Kettle M, DO   40 mg at 07/11/21 2005   enoxaparin (LOVENOX) injection 40 mg  40 mg Subcutaneous Daily Donnamae Jude, RPH   40 mg at 07/11/21 1884   HYDROmorphone (DILAUDID) injection 0.5-1 mg  0.5-1 mg Intravenous Q4H PRN Hongalgi, Lenis Dickinson, MD       insulin aspart (novoLOG) injection 0-15 Units  0-15 Units Subcutaneous TID WC Amin, Ankit Chirag, MD   3 Units at 07/12/21 0757   insulin aspart (novoLOG) injection 0-5 Units  0-5 Units Subcutaneous QHS Amin, Ankit Chirag, MD       insulin aspart (novoLOG) injection 2 Units  2 Units Subcutaneous TID WC Amin, Ankit Chirag, MD   2 Units at 07/12/21 0758   irbesartan (AVAPRO) tablet 150 mg  150 mg Oral Daily Jennette Kettle M, DO   150 mg at 07/11/21 1660   LORazepam (ATIVAN) tablet 1 mg  1 mg Oral Q6H PRN Etta Quill, DO   1 mg at 07/07/21 0055   ondansetron (ZOFRAN) tablet 4 mg  4 mg Oral Q6H PRN Etta Quill, DO       Or   ondansetron [facility]) injection 4 mg  4 mg Intravenous Q6H PRN Etta Quill, DO   4 mg at 07/10/21 1910   ondansetron [facility]) injection 4 mg  4 mg Intravenous Q6H Shawna Clamp, MD   4 mg at 07/12/21 0018   sodium chloride flush (NS) 0.9 % injection 5 mL  5 mL Intracatheter Q8H Criselda Peaches, MD   5 mL at 07/12/21 0601   traMADol (ULTRAM) tablet 50 mg  50 mg Oral Q6H PRN Modena Jansky, MD   50 mg at 07/11/21 1810   No Known Allergies:   Family History  Problem Relation Age of Onset   Pancreatic cancer Neg Hx   :  Social History   Socioeconomic History   Marital status: Married    Spouse name: Not on file   Number of children: Not on file   Years of education: Not on file   Highest education level: Not on file  Occupational History   Not on file  Tobacco Use   Smoking status: Never   Smokeless tobacco: Never  Substance and Sexual Activity   Alcohol use: Not Currently   Drug use: Never   Sexual activity: Not on file  Other Topics Concern   Not  on file  Social History Narrative   Not on file   Social Determinants of Health   Financial Resource Strain: Not on file  Food Insecurity: Not on file  Transportation Needs: Not on file  Physical Activity: Not on file  Stress: Not on file  Social Connections: Not on file  Intimate Partner Violence: Not on file  :  Review of Systems: A comprehensive 14 point review of systems was negative except as noted in the HPI.  Exam: Patient Vitals for the past 24 hrs:  BP Temp Temp src Pulse Resp SpO2  07/12/21 0732 113/81 98.1 F (36.7 C) Oral 74 17 93 %  07/12/21 0433 94/74 98.1 F (36.7 C) Oral 63 16 93 %  07/11/21 1959 97/75 97.8 F (36.6 C) Oral 73 16 91 %    General:  well-nourished in no acute distress.   Eyes: Scleral icterus present ENT:  There were no oropharyngeal lesions.    Lymphatics:  Negative cervical, supraclavicular or axillary adenopathy.   Respiratory: lungs were clear bilaterally without wheezing or crackles.   Cardiovascular:  Regular rate and rhythm, S1/S2, without murmur, rub or gallop.  There was no pedal edema.   GI:  abdomen was soft, flat, nontender, nondistended, without organomegaly.  Biliary drain present. Musculoskeletal: Strength symmetrical in the upper and lower extremities. Skin exam was without echymosis, petichae.   Neuro exam was nonfocal. Patient was alert and oriented.  Attention was good.   Language was appropriate.  Mood was normal without depression.  Speech was not pressured.  Thought content was not tangential.     Lab Results  Component Value Date   WBC 11.3 (H) 07/12/2021   HGB 18.1 (H) 07/12/2021   HCT 52.8 (H) 07/12/2021   PLT 149 (L) 07/12/2021   GLUCOSE 120 (H) 07/12/2021   ALT 117 (H) 07/12/2021   AST 100 (H) 07/12/2021   NA 134 (L) 07/12/2021   K 5.0 07/12/2021   CL 99 07/12/2021   CREATININE 2.82 (H) 07/12/2021   BUN 37 (H) 07/12/2021   CO2 23 07/12/2021    CT ABDOMEN PELVIS W CONTRAST  Result Date:  07/06/2021 CLINICAL DATA:  Jaundice EXAM: CT ABDOMEN AND PELVIS WITH CONTRAST TECHNIQUE: Multidetector CT imaging of the abdomen and pelvis was performed using the standard protocol following bolus administration of intravenous contrast. CONTRAST:  112m OMNIPAQUE IOHEXOL 350 MG/ML SOLN COMPARISON:  MRI 07/31/2017, CT 02/06/2012 FINDINGS: Lower chest: Lung bases demonstrate no acute consolidation or effusion. Borderline aneurysmal dilatation of aortic root, similar to 2012. Mild cardiomegaly. Hepatobiliary: Heterogeneous hyperenhancement within the right hepatic lobe. Moderate intra hepatic biliary dilatation. Poorly visible extrahepatic common bile duct, does not appear dilated at head of pancreas. Lobulated liver morphology consistent with cirrhosis. Multiple calcified gallstones. Pancreas: Unremarkable. No pancreatic ductal dilatation or surrounding inflammatory changes. Spleen: Enlarged measuring 15.5 cm Adrenals/Urinary Tract: Adrenal glands are normal. Kidneys show no hydronephrosis. Subcentimeter hypodense lesions too small to further characterize. The bladder is unremarkable Stomach/Bowel: The stomach is nonenlarged. No dilated small bowel. No acute bowel wall thickening. Vascular/Lymphatic: Nonaneurysmal aorta. Multiple enlarged porta hepatis lymph nodes measuring up to 16 mm. Distal esophageal node measuring 10 mm. Numerous small tortuous vessels in the left upper quadrant. Reproductive: Prostate is unremarkable. Other: No free air. No significant ascites. Fat containing right inguinal hernia. Musculoskeletal: No acute osseous abnormality IMPRESSION: 1. Liver cirrhosis. Interim development of moderate intra hepatic biliary dilatation. Poorly visible extrahepatic common bile duct which does not appear dilated at head of pancreas, findings raise concern for hilar obstructing process though no gross mass seen on CT. There is heterogeneous hyperenhancement of right hepatic lobe indeterminate for mass or  altered perfusion. Further evaluation with MRI/MRCP is recommended. 2. Mild porta hepatis adenopathy indeterminate for metastatic disease. 3. Splenomegaly and findings suggestive of portal hypertension Electronically Signed   By: KDonavan FoilM.D.   On: 07/06/2021 21:35   IR [place] Bx Asp/Drain  Result Date: 07/10/2021 INDICATION: 70year old male with evidence of cirrhosis, significant intrahepatic biliary ductal dilatation with biliary occlusion at the intrahepatic hilum, obstructed jaundice and a possible infiltrative mass within hepatic segment 8. Patient presents today for placement  a percutaneous biliary drainage catheter, attempted internalization past the central obstruction, brush biopsy and also potentially ultrasound-guided core biopsy of the suspected hepatic parenchymal mass. EXAM: 1. Percutaneous transhepatic cholangiogram 2. Transhepatic brush biopsy of biliary lesion 3. Successful crossing of biliary stenosis and placement of internal/external percutaneous biliary drainage catheter 4. Ultrasound-guided core biopsy of ill-defined masslike region in hepatic segment 8 MEDICATIONS: 2 g cefoxitin; The antibiotic was administered within an appropriate time frame prior to the initiation of the procedure. ANESTHESIA/SEDATION: Moderate (conscious) sedation was employed during this procedure. A total of Versed 4 mg and Fentanyl 200 mcg was administered intravenously. Moderate Sedation Time: 49 minutes. The patient's level of consciousness and vital signs were monitored continuously by radiology nursing throughout the procedure under my direct supervision. FLUOROSCOPY TIME:  Fluoroscopy Time: 12 minutes 24 seconds (143 mGy). COMPLICATIONS: None immediate. PROCEDURE: Informed written consent was obtained from the patient after a thorough discussion of the procedural risks, benefits and alternatives. All questions were addressed. Maximal Sterile Barrier Technique was utilized including caps, mask, sterile  gowns, sterile gloves, sterile drape, hand hygiene and skin antiseptic. A timeout was performed prior to the initiation of the procedure. Ultrasound was used to interrogate the left hemi liver. Biliary ductal dilatation is identified. A suitable skin entry site was selected and marked. Local anesthesia was attained by infiltration with 1% lidocaine. A small dermatotomy was made. Using a 21 gauge Chiba needle, the needle was carefully advanced through the left lobe of the liver and used to puncture the dilated bile duct. A transhepatic cholangiogram was then performed. This demonstrates successful access of a tertiary biliary radicle. There is marked intrahepatic biliary ductal dilatation. A large filling defect is present within the hepatic hilum extending into the common hepatic duct. Ultimately, a 0.018 microwire was advanced centrally within the biliary tree. The access needle was removed. The Accustick sheath was advanced over the wire and into the left main hepatic duct. Additional cholangiography was performed. There is no cross-filling into the right intrahepatic ducts. The cystic duct is patent. The common bile duct is patent. A road runner wire was used to successfully traverse the obstructing mass at the hepatic confluence. The Accustick sheath was then removed. The tract was serially dilated to 10 [place]. A 6 French vascular sheath was advanced over the wire and positioned adjacent to the obstructing mass in the common hepatic duct. Brush biopsies were then obtained. The 6 French sheath was then removed. A 10 [place] biliary drainage catheter was then advanced over the wire, past the occlusion and into the duodenum. The distal loop was reconstituted in the duodenum. Contrast injection confirms adequate placement of the catheter with persistent filling of the dilated intrahepatic left ducts and passage of contrast material into the duodenum. The catheter was secured to the skin with 0 Prolene suture.  Attention was next turned to the right upper quadrant. The right upper quadrant was interrogated with ultrasound. A region of heterogeneity in hepatic segment 8 was identified. This appears to correspond with the suspected mass lesion on prior CT and MRI imaging. Local anesthesia was again attained by infiltration with 1% lidocaine. A small dermatotomy was made. Under real-time ultrasound guidance, a 17 gauge introducer needle was advanced into the liver. Multiple 18 gauge core biopsies were then coaxially obtained using the bio Pince automated biopsy device. Biopsy specimens were placed in formalin and delivered to pathology for further analysis. As the introducer needle was removed, the biopsy tract was embolized with a Gel-Foam slurry.  IMPRESSION: 1. Successful percutaneous transhepatic cholangiogram demonstrating an obstructing mass at the intrahepatic confluence extending into the common hepatic duct. 2. Brush biopsy of intra biliary mass. 3. Successful placement of a 10 French internal/external biliary drainage catheter. 4. Ultrasound-guided core biopsy of region of ill-defined heterogeneity in segment 8 of the liver. PLAN: 1. Maintain internal/external biliary drainage catheter to bag until bilirubin has decreased adequately. Capping may then be attempted. 2. Due to underlying cirrhosis and hepatomegaly, the existing standard internal/external biliary drainage catheter just covers the occluded segment of the biliary tree. In the future, biliary drainage catheters may need to be modified with additional sideholes to provide more broad coverage. Electronically Signed   By: Jacqulynn Cadet M.D.   On: 07/10/2021 15:55   MR ABDOMEN MRCP W WO CONTAST  Result Date: 07/07/2021 CLINICAL DATA:  Recent jaundice. CT demonstrating cirrhosis and possible primary malignancy. EXAM: MRI ABDOMEN WITHOUT AND WITH CONTRAST (INCLUDING MRCP) TECHNIQUE: Multiplanar multisequence MR imaging of the abdomen was performed both  before and after the administration of intravenous contrast. Heavily T2-weighted images of the biliary and pancreatic ducts were obtained, and three-dimensional MRCP images were rendered by post processing. CONTRAST:  61m GADAVIST GADOBUTROL 1 MMOL/ML IV SOLN COMPARISON:  CT of 1 day prior FINDINGS: Moderately motion degraded exam. The extent of motion makes LI-RADS characterisation impossible. Lower chest: Mild cardiomegaly, without pericardial or pleural effusion. Hepatobiliary: Marked cirrhosis, with caudate lobe enlargement and irregular hepatic capsule. Centered in segment 8 is infiltrative relatively diffuse T2 hyperintensity on 09/05, corresponding to restricted diffusion on 43/6 and heterogeneous arterial phase hyperenhancement including on 40/1101. Example at on the order of 10.1 x 8.7 cm. The T2 signal and restricted diffusion extend towards the right portal vein, which is not opacified on post-contrast images, and likely involved with tumor. Arterial phase images demonstrate multiple other areas of subcentimeter hyperenhancement, not confidently identified on later post-contrast imaging. Indeterminate. Moderate intrahepatic biliary duct dilatation within the left hepatic lobe and posterior right hepatic lobe, followed to the presumed central right hepatic lobe tumor. Example obstruction on 50 through 53 of 1,103. Small gallstones without specific evidence of acute cholecystitis. No common duct dilatation. Pancreas:  Normal, without mass or ductal dilatation. Spleen:  Splenomegaly at 15.5 cm craniocaudal. Adrenals/Urinary Tract: Normal adrenal glands. Bilateral small renal cysts. No hydronephrosis. Stomach/Bowel: Normal stomach and abdominal bowel loops. Vascular/Lymphatic: Aortic atherosclerosis. The main portal vein and splenic veins are patent. Extensive portosystemic collaterals. 1.2 cm periesophageal node at the level of the diaphragmatic hiatus on 11/05. Gastrohepatic ligament node measures 1.5 cm on  77/1103. Other:  No ascites. Musculoskeletal: No acute osseous abnormality. Probable hemangiomas within the midthoracic and mid lumbar spine. IMPRESSION: 1. Moderately motion degraded exam, with limitations above. 2. 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 As above, the extent of motion degradation makes LI-Rads categorization impossible. 3. Upper abdominal adenopathy which could be metastatic or reactive. 4. Portal venous hypertension and splenomegaly. 5. Cholelithiasis Electronically Signed   By: KAbigail MiyamotoM.D.   On: 07/07/2021 06:25   IR INT EXT BILIARY DRAIN WITH CHOLANGIOGRAM  Result Date: 07/10/2021 INDICATION: 70year old male with evidence of cirrhosis, significant intrahepatic biliary ductal dilatation with biliary occlusion at the intrahepatic hilum, obstructed jaundice and a possible infiltrative mass within hepatic segment 8. Patient presents today for placement a percutaneous biliary drainage catheter, attempted internalization past the central obstruction, brush biopsy  and also potentially ultrasound-guided core biopsy of the suspected hepatic parenchymal mass. EXAM: 1. Percutaneous transhepatic cholangiogram 2. Transhepatic brush biopsy of biliary lesion 3. Successful crossing of biliary stenosis and placement of internal/external percutaneous biliary drainage catheter 4. Ultrasound-guided core biopsy of ill-defined masslike region in hepatic segment 8 MEDICATIONS: 2 g cefoxitin; The antibiotic was administered within an appropriate time frame prior to the initiation of the procedure. ANESTHESIA/SEDATION: Moderate (conscious) sedation was employed during this procedure. A total of Versed 4 mg and Fentanyl 200 mcg was administered intravenously. Moderate Sedation Time: 49 minutes. The patient's level of consciousness and vital  signs were monitored continuously by radiology nursing throughout the procedure under my direct supervision. FLUOROSCOPY TIME:  Fluoroscopy Time: 12 minutes 24 seconds (143 mGy). COMPLICATIONS: None immediate. PROCEDURE: Informed written consent was obtained from the patient after a thorough discussion of the procedural risks, benefits and alternatives. All questions were addressed. Maximal Sterile Barrier Technique was utilized including caps, mask, sterile gowns, sterile gloves, sterile drape, hand hygiene and skin antiseptic. A timeout was performed prior to the initiation of the procedure. Ultrasound was used to interrogate the left hemi liver. Biliary ductal dilatation is identified. A suitable skin entry site was selected and marked. Local anesthesia was attained by infiltration with 1% lidocaine. A small dermatotomy was made. Using a 21 gauge Chiba needle, the needle was carefully advanced through the left lobe of the liver and used to puncture the dilated bile duct. A transhepatic cholangiogram was then performed. This demonstrates successful access of a tertiary biliary radicle. There is marked intrahepatic biliary ductal dilatation. A large filling defect is present within the hepatic hilum extending into the common hepatic duct. Ultimately, a 0.018 microwire was advanced centrally within the biliary tree. The access needle was removed. The Accustick sheath was advanced over the wire and into the left main hepatic duct. Additional cholangiography was performed. There is no cross-filling into the right intrahepatic ducts. The cystic duct is patent. The common bile duct is patent. A road runner wire was used to successfully traverse the obstructing mass at the hepatic confluence. The Accustick sheath was then removed. The tract was serially dilated to 10 [place]. A 6 French vascular sheath was advanced over the wire and positioned adjacent to the obstructing mass in the common hepatic duct. Brush biopsies  were then obtained. The 6 French sheath was then removed. A 10 [place] biliary drainage catheter was then advanced over the wire, past the occlusion and into the duodenum. The distal loop was reconstituted in the duodenum. Contrast injection confirms adequate placement of the catheter with persistent filling of the dilated intrahepatic left ducts and passage of contrast material into the duodenum. The catheter was secured to the skin with 0 Prolene suture. Attention was next turned to the right upper quadrant. The right upper quadrant was interrogated with ultrasound. A region of heterogeneity in hepatic segment 8 was identified. This appears to correspond with the suspected mass lesion on prior CT and MRI imaging. Local anesthesia was again attained by infiltration with 1% lidocaine. A small dermatotomy was made. Under real-time ultrasound guidance, a 17 gauge introducer needle was advanced into the liver. Multiple 18 gauge core biopsies were then coaxially obtained using the bio Pince automated biopsy device. Biopsy specimens were placed in formalin and delivered to pathology for further analysis. As the introducer needle was removed, the biopsy tract was embolized with a Gel-Foam slurry. IMPRESSION: 1. Successful percutaneous transhepatic cholangiogram demonstrating an obstructing mass at the intrahepatic  confluence extending into the common hepatic duct. 2. Brush biopsy of intra biliary mass. 3. Successful placement of a 10 French internal/external biliary drainage catheter. 4. Ultrasound-guided core biopsy of region of ill-defined heterogeneity in segment 8 of the liver. PLAN: 1. Maintain internal/external biliary drainage catheter to bag until bilirubin has decreased adequately. Capping may then be attempted. 2. Due to underlying cirrhosis and hepatomegaly, the existing standard internal/external biliary drainage catheter just covers the occluded segment of the biliary tree. In the future, biliary drainage  catheters may need to be modified with additional sideholes to provide more broad coverage. Electronically Signed   By: Jacqulynn Cadet M.D.   On: 07/10/2021 15:55   IR ENDOLUMINAL BX OF BILIARY TREE  Result Date: 07/10/2021 INDICATION: 70 year old male with evidence of cirrhosis, significant intrahepatic biliary ductal dilatation with biliary occlusion at the intrahepatic hilum, obstructed jaundice and a possible infiltrative mass within hepatic segment 8. Patient presents today for placement a percutaneous biliary drainage catheter, attempted internalization past the central obstruction, brush biopsy and also potentially ultrasound-guided core biopsy of the suspected hepatic parenchymal mass. EXAM: 1. Percutaneous transhepatic cholangiogram 2. Transhepatic brush biopsy of biliary lesion 3. Successful crossing of biliary stenosis and placement of internal/external percutaneous biliary drainage catheter 4. Ultrasound-guided core biopsy of ill-defined masslike region in hepatic segment 8 MEDICATIONS: 2 g cefoxitin; The antibiotic was administered within an appropriate time frame prior to the initiation of the procedure. ANESTHESIA/SEDATION: Moderate (conscious) sedation was employed during this procedure. A total of Versed 4 mg and Fentanyl 200 mcg was administered intravenously. Moderate Sedation Time: 49 minutes. The patient's level of consciousness and vital signs were monitored continuously by radiology nursing throughout the procedure under my direct supervision. FLUOROSCOPY TIME:  Fluoroscopy Time: 12 minutes 24 seconds (143 mGy). COMPLICATIONS: None immediate. PROCEDURE: Informed written consent was obtained from the patient after a thorough discussion of the procedural risks, benefits and alternatives. All questions were addressed. Maximal Sterile Barrier Technique was utilized including caps, mask, sterile gowns, sterile gloves, sterile drape, hand hygiene and skin antiseptic. A timeout was performed  prior to the initiation of the procedure. Ultrasound was used to interrogate the left hemi liver. Biliary ductal dilatation is identified. A suitable skin entry site was selected and marked. Local anesthesia was attained by infiltration with 1% lidocaine. A small dermatotomy was made. Using a 21 gauge Chiba needle, the needle was carefully advanced through the left lobe of the liver and used to puncture the dilated bile duct. A transhepatic cholangiogram was then performed. This demonstrates successful access of a tertiary biliary radicle. There is marked intrahepatic biliary ductal dilatation. A large filling defect is present within the hepatic hilum extending into the common hepatic duct. Ultimately, a 0.018 microwire was advanced centrally within the biliary tree. The access needle was removed. The Accustick sheath was advanced over the wire and into the left main hepatic duct. Additional cholangiography was performed. There is no cross-filling into the right intrahepatic ducts. The cystic duct is patent. The common bile duct is patent. A road runner wire was used to successfully traverse the obstructing mass at the hepatic confluence. The Accustick sheath was then removed. The tract was serially dilated to 10 [place]. A 6 French vascular sheath was advanced over the wire and positioned adjacent to the obstructing mass in the common hepatic duct. Brush biopsies were then obtained. The 6 French sheath was then removed. A 10 French biliary drainage catheter was then advanced over the wire, past the occlusion and  into the duodenum. The distal loop was reconstituted in the duodenum. Contrast injection confirms adequate placement of the catheter with persistent filling of the dilated intrahepatic left ducts and passage of contrast material into the duodenum. The catheter was secured to the skin with 0 Prolene suture. Attention was next turned to the right upper quadrant. The right upper quadrant was interrogated with  ultrasound. A region of heterogeneity in hepatic segment 8 was identified. This appears to correspond with the suspected mass lesion on prior CT and MRI imaging. Local anesthesia was again attained by infiltration with 1% lidocaine. A small dermatotomy was made. Under real-time ultrasound guidance, a 17 gauge introducer needle was advanced into the liver. Multiple 18 gauge core biopsies were then coaxially obtained using the bio Pince automated biopsy device. Biopsy specimens were placed in formalin and delivered to pathology for further analysis. As the introducer needle was removed, the biopsy tract was embolized with a Gel-Foam slurry. IMPRESSION: 1. Successful percutaneous transhepatic cholangiogram demonstrating an obstructing mass at the intrahepatic confluence extending into the common hepatic duct. 2. Brush biopsy of intra biliary mass. 3. Successful placement of a 10 French internal/external biliary drainage catheter. 4. Ultrasound-guided core biopsy of region of ill-defined heterogeneity in segment 8 of the liver. PLAN: 1. Maintain internal/external biliary drainage catheter to bag until bilirubin has decreased adequately. Capping may then be attempted. 2. Due to underlying cirrhosis and hepatomegaly, the existing standard internal/external biliary drainage catheter just covers the occluded segment of the biliary tree. In the future, biliary drainage catheters may need to be modified with additional sideholes to provide more broad coverage. Electronically Signed   By: Jacqulynn Cadet M.D.   On: 07/10/2021 15:55   US Abdomen Limited RUQ (LIVER/GB)  Result Date: 07/11/2021 CLINICAL DATA:  70 year old male with history of cirrhosis and recently diagnosed infiltrative hepatic mass, postprocedure day #1 after left-sided percutaneous internal external biliary drain, now with diffuse severe abdominal pain. EXAM: ULTRASOUND ABDOMEN LIMITED RIGHT UPPER QUADRANT COMPARISON:  07/10/2021, 07/07/2021 FINDINGS:  Gallbladder: Scattered echogenic gallstones, the largest measuring up to 8 mm. No gallbladder distension, significant wall thickening, or pericholecystic fluid. No sonographic Percell Miller sign reported. Common bile duct: Not visualized. Liver: Mild diffuse intrahepatic biliary ductal dilation, equal in the right and left lobes. The known hepatic mass is not well appreciated sonographically. Heterogeneous hepatic parenchyma with nodular contour. Portal vein is patent on color Doppler imaging with normal direction of blood flow towards the liver. Other: No perihepatic ascites. IMPRESSION: 1. No acute right upper quadrant abnormality. 2. Similar to slightly decreased mild diffuse intrahepatic biliary ductal dilation from MRI comparison after left-sided biliary drain placement. 3. The known infiltrative hepatic mass in segment 8 is not well demonstrated sonographically. 4. Cholelithiasis. Ruthann Cancer, MD [facility] Electronically Signed   By: Ruthann Cancer M.D.   On: 07/11/2021 16:33     CT ABDOMEN PELVIS W CONTRAST  Result Date: 07/06/2021 CLINICAL DATA:  Jaundice EXAM: CT ABDOMEN AND PELVIS WITH CONTRAST TECHNIQUE: Multidetector CT imaging of the abdomen and pelvis was performed using the standard protocol following bolus administration of intravenous contrast. CONTRAST:  145m OMNIPAQUE IOHEXOL 350 MG/ML SOLN COMPARISON:  MRI 07/31/2017, CT 02/06/2012 FINDINGS: Lower chest: Lung bases demonstrate no acute consolidation or effusion. Borderline aneurysmal dilatation of aortic root, similar to 2012. Mild cardiomegaly. Hepatobiliary: Heterogeneous hyperenhancement within the right hepatic lobe. Moderate intra hepatic biliary dilatation. Poorly visible extrahepatic common bile duct, does not appear dilated at head of  pancreas. Lobulated liver morphology consistent with cirrhosis. Multiple calcified gallstones. Pancreas: Unremarkable. No pancreatic ductal  dilatation or surrounding inflammatory changes. Spleen: Enlarged measuring 15.5 cm Adrenals/Urinary Tract: Adrenal glands are normal. Kidneys show no hydronephrosis. Subcentimeter hypodense lesions too small to further characterize. The bladder is unremarkable Stomach/Bowel: The stomach is nonenlarged. No dilated small bowel. No acute bowel wall thickening. Vascular/Lymphatic: Nonaneurysmal aorta. Multiple enlarged porta hepatis lymph nodes measuring up to 16 mm. Distal esophageal node measuring 10 mm. Numerous small tortuous vessels in the left upper quadrant. Reproductive: Prostate is unremarkable. Other: No free air. No significant ascites. Fat containing right inguinal hernia. Musculoskeletal: No acute osseous abnormality IMPRESSION: 1. Liver cirrhosis. Interim development of moderate intra hepatic biliary dilatation. Poorly visible extrahepatic common bile duct which does not appear dilated at head of pancreas, findings raise concern for hilar obstructing process though no gross mass seen on CT. There is heterogeneous hyperenhancement of right hepatic lobe indeterminate for mass or altered perfusion. Further evaluation with MRI/MRCP is recommended. 2. Mild porta hepatis adenopathy indeterminate for metastatic disease. 3. Splenomegaly and findings suggestive of portal hypertension Electronically Signed   By: Donavan Foil M.D.   On: 07/06/2021 21:35   IR US Guide Bx Asp/Drain  Result Date: 07/10/2021 INDICATION: 70 year old male with evidence of cirrhosis, significant intrahepatic biliary ductal dilatation with biliary occlusion at the intrahepatic hilum, obstructed jaundice and a possible infiltrative mass within hepatic segment 8. Patient presents today for placement a percutaneous biliary drainage catheter, attempted internalization past the central obstruction, brush biopsy and also potentially ultrasound-guided core biopsy of the suspected hepatic parenchymal mass. EXAM: 1. Percutaneous transhepatic  cholangiogram 2. Transhepatic brush biopsy of biliary lesion 3. Successful crossing of biliary stenosis and placement of internal/external percutaneous biliary drainage catheter 4. Ultrasound-guided core biopsy of ill-defined masslike region in hepatic segment 8 MEDICATIONS: 2 g cefoxitin; The antibiotic was administered within an appropriate time frame prior to the initiation of the procedure. ANESTHESIA/SEDATION: Moderate (conscious) sedation was employed during this procedure. A total of Versed 4 mg and Fentanyl 200 mcg was administered intravenously. Moderate Sedation Time: 49 minutes. The patient's level of consciousness and vital signs were monitored continuously by radiology nursing throughout the procedure under my direct supervision. FLUOROSCOPY TIME:  Fluoroscopy Time: 12 minutes 24 seconds (143 mGy). COMPLICATIONS: None immediate. PROCEDURE: Informed written consent was obtained from the patient after a thorough discussion of the procedural risks, benefits and alternatives. All questions were addressed. Maximal Sterile Barrier Technique was utilized including caps, mask, sterile gowns, sterile gloves, sterile drape, hand hygiene and skin antiseptic. A timeout was performed prior to the initiation of the procedure. Ultrasound was used to interrogate the left hemi liver. Biliary ductal dilatation is identified. A suitable skin entry site was selected and marked. Local anesthesia was attained by infiltration with 1% lidocaine. A small dermatotomy was made. Using a 21 gauge Chiba needle, the needle was carefully advanced through the left lobe of the liver and used to puncture the dilated bile duct. A transhepatic cholangiogram was then performed. This demonstrates successful access of a tertiary biliary radicle. There is marked intrahepatic biliary ductal dilatation. A large filling defect is present within the hepatic hilum extending into the common hepatic duct. Ultimately, a 0.018 microwire was advanced  centrally within the biliary tree. The access needle was removed. The Accustick sheath was advanced over the wire and into the left main hepatic duct. Additional cholangiography was performed. There is no cross-filling into the right intrahepatic ducts. The cystic duct  is patent. The common bile duct is patent. A road runner wire was used to successfully traverse the obstructing mass at the hepatic confluence. The Accustick sheath was then removed. The tract was serially dilated to 10 [place]. A 6 French vascular sheath was advanced over the wire and positioned adjacent to the obstructing mass in the common hepatic duct. Brush biopsies were then obtained. The 6 French sheath was then removed. A 10 [place] biliary drainage catheter was then advanced over the wire, past the occlusion and into the duodenum. The distal loop was reconstituted in the duodenum. Contrast injection confirms adequate placement of the catheter with persistent filling of the dilated intrahepatic left ducts and passage of contrast material into the duodenum. The catheter was secured to the skin with 0 Prolene suture. Attention was next turned to the right upper quadrant. The right upper quadrant was interrogated with ultrasound. A region of heterogeneity in hepatic segment 8 was identified. This appears to correspond with the suspected mass lesion on prior CT and MRI imaging. Local anesthesia was again attained by infiltration with 1% lidocaine. A small dermatotomy was made. Under real-time ultrasound guidance, a 17 gauge introducer needle was advanced into the liver. Multiple 18 gauge core biopsies were then coaxially obtained using the bio Pince automated biopsy device. Biopsy specimens were placed in formalin and delivered to pathology for further analysis. As the introducer needle was removed, the biopsy tract was embolized with a Gel-Foam slurry. IMPRESSION: 1. Successful percutaneous transhepatic cholangiogram demonstrating an obstructing  mass at the intrahepatic confluence extending into the common hepatic duct. 2. Brush biopsy of intra biliary mass. 3. Successful placement of a 10 French internal/external biliary drainage catheter. 4. Ultrasound-guided core biopsy of region of ill-defined heterogeneity in segment 8 of the liver. PLAN: 1. Maintain internal/external biliary drainage catheter to bag until bilirubin has decreased adequately. Capping may then be attempted. 2. Due to underlying cirrhosis and hepatomegaly, the existing standard internal/external biliary drainage catheter just covers the occluded segment of the biliary tree. In the future, biliary drainage catheters may need to be modified with additional sideholes to provide more broad coverage. Electronically Signed   By: Jacqulynn Cadet M.D.   On: 07/10/2021 15:55   MR ABDOMEN MRCP W WO CONTAST  Result Date: 07/07/2021 CLINICAL DATA:  Recent jaundice. CT demonstrating cirrhosis and possible primary malignancy. EXAM: MRI ABDOMEN WITHOUT AND WITH CONTRAST (INCLUDING MRCP) TECHNIQUE: Multiplanar multisequence MR imaging of the abdomen was performed both before and after the administration of intravenous contrast. Heavily T2-weighted images of the biliary and pancreatic ducts were obtained, and three-dimensional MRCP images were rendered by post processing. CONTRAST:  29m GADAVIST GADOBUTROL 1 MMOL/ML IV SOLN COMPARISON:  CT of 1 day prior FINDINGS: Moderately motion degraded exam. The extent of motion makes LI-RADS characterisation impossible. Lower chest: Mild cardiomegaly, without pericardial or pleural effusion. Hepatobiliary: Marked cirrhosis, with caudate lobe enlargement and irregular hepatic capsule. Centered in segment 8 is infiltrative relatively diffuse T2 hyperintensity on 09/05, corresponding to restricted diffusion on 43/6 and heterogeneous arterial phase hyperenhancement including on 40/1101. Example at on the order of 10.1 x 8.7 cm. The T2 signal and restricted  diffusion extend towards the right portal vein, which is not opacified on post-contrast images, and likely involved with tumor. Arterial phase images demonstrate multiple other areas of subcentimeter hyperenhancement, not confidently identified on later post-contrast imaging. Indeterminate. Moderate intrahepatic biliary duct dilatation within the left hepatic lobe and posterior right hepatic lobe, followed to the presumed central right  hepatic lobe tumor. Example obstruction on 50 through 53 of 1,103. Small gallstones without specific evidence of acute cholecystitis. No common duct dilatation. Pancreas:  Normal, without mass or ductal dilatation. Spleen:  Splenomegaly at 15.5 cm craniocaudal. Adrenals/Urinary Tract: Normal adrenal glands. Bilateral small renal cysts. No hydronephrosis. Stomach/Bowel: Normal stomach and abdominal bowel loops. Vascular/Lymphatic: Aortic atherosclerosis. The main portal vein and splenic veins are patent. Extensive portosystemic collaterals. 1.2 cm periesophageal node at the level of the diaphragmatic hiatus on 11/05. Gastrohepatic ligament node measures 1.5 cm on 77/1103. Other:  No ascites. Musculoskeletal: No acute osseous abnormality. Probable hemangiomas within the midthoracic and mid lumbar spine. IMPRESSION: 1. Moderately motion degraded exam, with limitations above. 2. 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 As above, the extent of motion degradation makes LI-Rads categorization impossible. 3. Upper abdominal adenopathy which could be metastatic or reactive. 4. Portal venous hypertension and splenomegaly. 5. Cholelithiasis Electronically Signed   By: Abigail Miyamoto M.D.   On: 07/07/2021 06:25   IR INT EXT BILIARY DRAIN WITH CHOLANGIOGRAM  Result Date: 07/10/2021 INDICATION: 70 year old male with evidence of  cirrhosis, significant intrahepatic biliary ductal dilatation with biliary occlusion at the intrahepatic hilum, obstructed jaundice and a possible infiltrative mass within hepatic segment 8. Patient presents today for placement a percutaneous biliary drainage catheter, attempted internalization past the central obstruction, brush biopsy and also potentially ultrasound-guided core biopsy of the suspected hepatic parenchymal mass. EXAM: 1. Percutaneous transhepatic cholangiogram 2. Transhepatic brush biopsy of biliary lesion 3. Successful crossing of biliary stenosis and placement of internal/external percutaneous biliary drainage catheter 4. Ultrasound-guided core biopsy of ill-defined masslike region in hepatic segment 8 MEDICATIONS: 2 g cefoxitin; The antibiotic was administered within an appropriate time frame prior to the initiation of the procedure. ANESTHESIA/SEDATION: Moderate (conscious) sedation was employed during this procedure. A total of Versed 4 mg and Fentanyl 200 mcg was administered intravenously. Moderate Sedation Time: 49 minutes. The patient's level of consciousness and vital signs were monitored continuously by radiology nursing throughout the procedure under my direct supervision. FLUOROSCOPY TIME:  Fluoroscopy Time: 12 minutes 24 seconds (143 mGy). COMPLICATIONS: None immediate. PROCEDURE: Informed written consent was obtained from the patient after a thorough discussion of the procedural risks, benefits and alternatives. All questions were addressed. Maximal Sterile Barrier Technique was utilized including caps, mask, sterile gowns, sterile gloves, sterile drape, hand hygiene and skin antiseptic. A timeout was performed prior to the initiation of the procedure. Ultrasound was used to interrogate the left hemi liver. Biliary ductal dilatation is identified. A suitable skin entry site was selected and marked. Local anesthesia was attained by infiltration with 1% lidocaine. A small dermatotomy  was made. Using a 21 gauge Chiba needle, the needle was carefully advanced through the left lobe of the liver and used to puncture the dilated bile duct. A transhepatic cholangiogram was then performed. This demonstrates successful access of a tertiary biliary radicle. There is marked intrahepatic biliary ductal dilatation. A large filling defect is present within the hepatic hilum extending into the common hepatic duct. Ultimately, a 0.018 microwire was advanced centrally within the biliary tree. The access needle was removed. The Accustick sheath was advanced over the wire and into the left main hepatic duct. Additional cholangiography was performed. There is no cross-filling into the right intrahepatic ducts. The cystic duct is patent. The common bile duct is patent. A road runner wire was  used to successfully traverse the obstructing mass at the hepatic confluence. The Accustick sheath was then removed. The tract was serially dilated to 10 [place]. A 6 French vascular sheath was advanced over the wire and positioned adjacent to the obstructing mass in the common hepatic duct. Brush biopsies were then obtained. The 6 French sheath was then removed. A 10 [place] biliary drainage catheter was then advanced over the wire, past the occlusion and into the duodenum. The distal loop was reconstituted in the duodenum. Contrast injection confirms adequate placement of the catheter with persistent filling of the dilated intrahepatic left ducts and passage of contrast material into the duodenum. The catheter was secured to the skin with 0 Prolene suture. Attention was next turned to the right upper quadrant. The right upper quadrant was interrogated with ultrasound. A region of heterogeneity in hepatic segment 8 was identified. This appears to correspond with the suspected mass lesion on prior CT and MRI imaging. Local anesthesia was again attained by infiltration with 1% lidocaine. A small dermatotomy was made. Under  real-time ultrasound guidance, a 17 gauge introducer needle was advanced into the liver. Multiple 18 gauge core biopsies were then coaxially obtained using the bio Pince automated biopsy device. Biopsy specimens were placed in formalin and delivered to pathology for further analysis. As the introducer needle was removed, the biopsy tract was embolized with a Gel-Foam slurry. IMPRESSION: 1. Successful percutaneous transhepatic cholangiogram demonstrating an obstructing mass at the intrahepatic confluence extending into the common hepatic duct. 2. Brush biopsy of intra biliary mass. 3. Successful placement of a 10 French internal/external biliary drainage catheter. 4. Ultrasound-guided core biopsy of region of ill-defined heterogeneity in segment 8 of the liver. PLAN: 1. Maintain internal/external biliary drainage catheter to bag until bilirubin has decreased adequately. Capping may then be attempted. 2. Due to underlying cirrhosis and hepatomegaly, the existing standard internal/external biliary drainage catheter just covers the occluded segment of the biliary tree. In the future, biliary drainage catheters may need to be modified with additional sideholes to provide more broad coverage. Electronically Signed   By: Jacqulynn Cadet M.D.   On: 07/10/2021 15:55   IR ENDOLUMINAL BX OF BILIARY TREE  Result Date: 07/10/2021 INDICATION: 70 year old male with evidence of cirrhosis, significant intrahepatic biliary ductal dilatation with biliary occlusion at the intrahepatic hilum, obstructed jaundice and a possible infiltrative mass within hepatic segment 8. Patient presents today for placement a percutaneous biliary drainage catheter, attempted internalization past the central obstruction, brush biopsy and also potentially ultrasound-guided core biopsy of the suspected hepatic parenchymal mass. EXAM: 1. Percutaneous transhepatic cholangiogram 2. Transhepatic brush biopsy of biliary lesion 3. Successful crossing of  biliary stenosis and placement of internal/external percutaneous biliary drainage catheter 4. Ultrasound-guided core biopsy of ill-defined masslike region in hepatic segment 8 MEDICATIONS: 2 g cefoxitin; The antibiotic was administered within an appropriate time frame prior to the initiation of the procedure. ANESTHESIA/SEDATION: Moderate (conscious) sedation was employed during this procedure. A total of Versed 4 mg and Fentanyl 200 mcg was administered intravenously. Moderate Sedation Time: 49 minutes. The patient's level of consciousness and vital signs were monitored continuously by radiology nursing throughout the procedure under my direct supervision. FLUOROSCOPY TIME:  Fluoroscopy Time: 12 minutes 24 seconds (143 mGy). COMPLICATIONS: None immediate. PROCEDURE: Informed written consent was obtained from the patient after a thorough discussion of the procedural risks, benefits and alternatives. All questions were addressed. Maximal Sterile Barrier Technique was utilized including caps, mask, sterile gowns, sterile gloves, sterile drape, hand hygiene  and skin antiseptic. A timeout was performed prior to the initiation of the procedure. Ultrasound was used to interrogate the left hemi liver. Biliary ductal dilatation is identified. A suitable skin entry site was selected and marked. Local anesthesia was attained by infiltration with 1% lidocaine. A small dermatotomy was made. Using a 21 gauge Chiba needle, the needle was carefully advanced through the left lobe of the liver and used to puncture the dilated bile duct. A transhepatic cholangiogram was then performed. This demonstrates successful access of a tertiary biliary radicle. There is marked intrahepatic biliary ductal dilatation. A large filling defect is present within the hepatic hilum extending into the common hepatic duct. Ultimately, a 0.018 microwire was advanced centrally within the biliary tree. The access needle was removed. The Accustick sheath  was advanced over the wire and into the left main hepatic duct. Additional cholangiography was performed. There is no cross-filling into the right intrahepatic ducts. The cystic duct is patent. The common bile duct is patent. A road runner wire was used to successfully traverse the obstructing mass at the hepatic confluence. The Accustick sheath was then removed. The tract was serially dilated to 10 [place]. A 6 French vascular sheath was advanced over the wire and positioned adjacent to the obstructing mass in the common hepatic duct. Brush biopsies were then obtained. The 6 French sheath was then removed. A 10 [place] biliary drainage catheter was then advanced over the wire, past the occlusion and into the duodenum. The distal loop was reconstituted in the duodenum. Contrast injection confirms adequate placement of the catheter with persistent filling of the dilated intrahepatic left ducts and passage of contrast material into the duodenum. The catheter was secured to the skin with 0 Prolene suture. Attention was next turned to the right upper quadrant. The right upper quadrant was interrogated with ultrasound. A region of heterogeneity in hepatic segment 8 was identified. This appears to correspond with the suspected mass lesion on prior CT and MRI imaging. Local anesthesia was again attained by infiltration with 1% lidocaine. A small dermatotomy was made. Under real-time ultrasound guidance, a 17 gauge introducer needle was advanced into the liver. Multiple 18 gauge core biopsies were then coaxially obtained using the bio Pince automated biopsy device. Biopsy specimens were placed in formalin and delivered to pathology for further analysis. As the introducer needle was removed, the biopsy tract was embolized with a Gel-Foam slurry. IMPRESSION: 1. Successful percutaneous transhepatic cholangiogram demonstrating an obstructing mass at the intrahepatic confluence extending into the common hepatic duct. 2. Brush  biopsy of intra biliary mass. 3. Successful placement of a 10 French internal/external biliary drainage catheter. 4. Ultrasound-guided core biopsy of region of ill-defined heterogeneity in segment 8 of the liver. PLAN: 1. Maintain internal/external biliary drainage catheter to bag until bilirubin has decreased adequately. Capping may then be attempted. 2. Due to underlying cirrhosis and hepatomegaly, the existing standard internal/external biliary drainage catheter just covers the occluded segment of the biliary tree. In the future, biliary drainage catheters may need to be modified with additional sideholes to provide more broad coverage. Electronically Signed   By: Jacqulynn Cadet M.D.   On: 07/10/2021 15:55   US Abdomen Limited RUQ (LIVER/GB)  Result Date: 07/11/2021 CLINICAL DATA:  70 year old male with history of cirrhosis and recently diagnosed infiltrative hepatic mass, postprocedure day #1 after left-sided percutaneous internal external biliary drain, now with diffuse severe abdominal pain. EXAM: ULTRASOUND ABDOMEN LIMITED RIGHT UPPER QUADRANT COMPARISON:  07/10/2021, 07/07/2021 FINDINGS: Gallbladder: Scattered echogenic gallstones,  the largest measuring up to 8 mm. No gallbladder distension, significant wall thickening, or pericholecystic fluid. No sonographic Percell Miller sign reported. Common bile duct: Not visualized. Liver: Mild diffuse intrahepatic biliary ductal dilation, equal in the right and left lobes. The known hepatic mass is not well appreciated sonographically. Heterogeneous hepatic parenchyma with nodular contour. Portal vein is patent on color Doppler imaging with normal direction of blood flow towards the liver. Other: No perihepatic ascites. IMPRESSION: 1. No acute right upper quadrant abnormality. 2. Similar to slightly decreased mild diffuse intrahepatic biliary ductal dilation from MRI comparison after left-sided biliary drain placement. 3. The known infiltrative hepatic mass in segment  8 is not well demonstrated sonographically. 4. Cholelithiasis. Ruthann Cancer, MD [facility] Radiology Electronically Signed   By: Ruthann Cancer M.D.   On: 07/11/2021 16:33    Pathology:  SURGICAL PATHOLOGY  CASE: [id number]  PATIENT: Imelda Pillow  Surgical Pathology Report   Clinical History: cirrhosis with ill-defined infiltrative liver mass and  obstructed jaundice, cholangiocarcinoma vs HCC (cm)   FINAL MICROSCOPIC DIAGNOSIS:   A. LIVER, NEEDLE CORE BIOPSY:  - Adenocarcinoma, see comment   COMMENT:   Findings are consistent with cholangiocarcinoma in absence of any other  known malignancies.  Dr. Tresa Moore reviewed the case and concurs with the  diagnosis.  Dr. Laurence Ferrari was paged on 07/11/2021.    Assessment and Plan:  1.  Cholangiocarcinoma 2.  Transaminitis and hyperbilirubinemia 3.  AKI 4.  Elevated hemoglobin 5.  Cirrhosis 6.  Portal venous hypertension splenomegaly 7.  Hypertension 8.  Diabetes mellitus  -Biopsy results and imaging findings have been discussed with the patient.  Recommend additional staging work-up including a CT of the chest.  This will need to be done without contrast due to renal function. -The patient will need systemic chemotherapy as an outpatient.  He lives in [place] and unsure if he wants to be treated in [place] versus [place].  Chemotherapy regimen per Dr. Burr Medico.  -Continue to monitor renal and liver function closely.  Thank you for this referral.   Mikey Bussing, DNP, AGPCNP-BC, AOCNP   Addendum I have seen the patient, examined him. I agree with the assessment and and plan and have edited the notes.   70 yo male with PMH of DM, HTN but otherwise healthy and active male presented with acute onset painless jaundice.  He is status post PTC.  I have reviewed his CT and MRI images, and liver biopsy results with patient in detail. Biopsy confirmed cholangiocarcinoma, no liver or distant  mets on images. Will get CT chest wo contrast to complete staging. Due to the large size of tumor and his underline liver cirrhosis, surgical resection may be challenging.  I will discuss with Dr. Barry Dienes and Dr. Zenia Resides, to see if he is a candidate for surgery.  I will order CA19.9. We will review his case in our GI tumor board next week.  If surgery is not an option, I recommend systemic chemotherapy and immunotherapy.  Palliative radiation therapy, or Y 90 could be also considered. I discussed the above with pt in detail, and will call his wife tomorrow, per his request. Pt unfortunately developed AKI after PTC placement, which is likely related to high output from [facility] which has improved today. He is eager to go home tomorrow to celebrate his grandson's 10th birthday. He is not sure if he wants to f/u with [place] at our [facility] office, or see my partner at [place] office.  We will f/u and arrange his office f/u.   Truitt Merle  07/12/2021

## 2021-07-12 NOTE — Progress Notes (Signed)
Referring Physician(s): Dr. Tarri Glenn  Supervising Physician: Markus Daft  Patient Status:  [facility] - In-pt  Chief Complaint: 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 Laurence Ferrari  Subjective: Patient in bed, wife at the bedside. He denies any pain/discomfort and states he feels much better compared to yesterday.   Allergies: Patient has no known allergies.  Medications: Prior to Admission medications   Medication Sig Start Date End Date Taking? Authorizing Provider  atorvastatin (LIPITOR) 40 MG tablet Take 40 mg by mouth at bedtime. 03/22/16  Yes [provider]  irbesartan (AVAPRO) 150 MG tablet Take 150 mg by mouth daily. 05/07/21  Yes [provider]  metFORMIN (GLUCOPHAGE) 1000 MG tablet Take 1,000 mg by mouth 2 (two) times daily with a meal. 06/21/21  Yes [provider]  RYBELSUS 7 MG TABS Take 7 mg by mouth every morning. 06/25/21  Yes [provider]     Vital Signs: BP 106/78 (BP Location: Left Arm)   Pulse 71   Temp 98.1 F (36.7 C) (Oral)   Resp 18   Ht '5\' 3"'$  (1.6 m)   Wt 196 lb (88.9 kg)   SpO2 93%   BMI 34.72 kg/m   Physical Exam Constitutional:      General: He is not in acute distress.    Appearance: He is not ill-appearing.  Pulmonary:     Effort: Pulmonary effort is normal.  Abdominal:     Palpations: Abdomen is soft.     Tenderness: There is no abdominal tenderness.     Comments: Biliary drain with scant amount of orange fluid in bag. Patient denies any abdominal pain or discomfort.   Musculoskeletal:     Right lower leg: No edema.     Left lower leg: No edema.  Skin:    General: Skin is warm and dry.  Neurological:     Mental Status: He is alert and oriented to person, place, and time.    Imaging: IR  US Guide Bx Asp/Drain  Result Date: 07/10/2021 INDICATION: 70 year old male with evidence of cirrhosis, significant intrahepatic biliary ductal dilatation with biliary occlusion at the intrahepatic hilum, obstructed jaundice and a possible infiltrative mass within hepatic segment 8. Patient presents today for placement a percutaneous biliary drainage catheter, attempted internalization past the central obstruction, brush biopsy and also potentially ultrasound-guided core biopsy of the suspected hepatic parenchymal mass. EXAM: 1. Percutaneous transhepatic cholangiogram 2. Transhepatic brush biopsy of biliary lesion 3. Successful crossing of biliary stenosis and placement of internal/external percutaneous biliary drainage catheter 4. Ultrasound-guided core biopsy of ill-defined masslike region in hepatic segment 8 MEDICATIONS: 2 g cefoxitin; The antibiotic was administered within an appropriate time frame prior to the initiation of the procedure. ANESTHESIA/SEDATION: Moderate (conscious) sedation was employed during this procedure. A total of Versed 4 mg and Fentanyl 200 mcg was administered intravenously. Moderate Sedation Time: 49 minutes. The patient's level of consciousness and vital signs were monitored continuously by radiology nursing throughout the procedure under my direct supervision. FLUOROSCOPY TIME:  Fluoroscopy Time: 12 minutes 24 seconds (143 mGy). COMPLICATIONS: None immediate. PROCEDURE: Informed written consent was obtained from the patient after a thorough discussion of the procedural risks, benefits and alternatives. All questions were addressed. Maximal Sterile Barrier Technique was utilized including caps, mask, sterile gowns, sterile gloves, sterile drape, hand hygiene and skin  antiseptic. A timeout was performed prior to the initiation of the procedure. Ultrasound was used to interrogate the left hemi liver. Biliary ductal dilatation is identified. A suitable skin entry site was selected and  marked. Local anesthesia was attained by infiltration with 1% lidocaine. A small dermatotomy was made. Using a 21 gauge Chiba needle, the needle was carefully advanced through the left lobe of the liver and used to puncture the dilated bile duct. A transhepatic cholangiogram was then performed. This demonstrates successful access of a tertiary biliary radicle. There is marked intrahepatic biliary ductal dilatation. A large filling defect is present within the hepatic hilum extending into the common hepatic duct. Ultimately, a 0.018 microwire was advanced centrally within the biliary tree. The access needle was removed. The Accustick sheath was advanced over the wire and into the left main hepatic duct. Additional cholangiography was performed. There is no cross-filling into the right intrahepatic ducts. The cystic duct is patent. The common bile duct is patent. A road runner wire was used to successfully traverse the obstructing mass at the hepatic confluence. The Accustick sheath was then removed. The tract was serially dilated to 10 [place]. A 6 French vascular sheath was advanced over the wire and positioned adjacent to the obstructing mass in the common hepatic duct. Brush biopsies were then obtained. The 6 French sheath was then removed. A 10 [place] biliary drainage catheter was then advanced over the wire, past the occlusion and into the duodenum. The distal loop was reconstituted in the duodenum. Contrast injection confirms adequate placement of the catheter with persistent filling of the dilated intrahepatic left ducts and passage of contrast material into the duodenum. The catheter was secured to the skin with 0 Prolene suture. Attention was next turned to the right upper quadrant. The right upper quadrant was interrogated with ultrasound. A region of heterogeneity in hepatic segment 8 was identified. This appears to correspond with the suspected mass lesion on prior CT and MRI imaging. Local anesthesia was  again attained by infiltration with 1% lidocaine. A small dermatotomy was made. Under real-time ultrasound guidance, a 17 gauge introducer needle was advanced into the liver. Multiple 18 gauge core biopsies were then coaxially obtained using the bio Pince automated biopsy device. Biopsy specimens were placed in formalin and delivered to pathology for further analysis. As the introducer needle was removed, the biopsy tract was embolized with a Gel-Foam slurry. IMPRESSION: 1. Successful percutaneous transhepatic cholangiogram demonstrating an obstructing mass at the intrahepatic confluence extending into the common hepatic duct. 2. Brush biopsy of intra biliary mass. 3. Successful placement of a 10 French internal/external biliary drainage catheter. 4. Ultrasound-guided core biopsy of region of ill-defined heterogeneity in segment 8 of the liver. PLAN: 1. Maintain internal/external biliary drainage catheter to bag until bilirubin has decreased adequately. Capping may then be attempted. 2. Due to underlying cirrhosis and hepatomegaly, the existing standard internal/external biliary drainage catheter just covers the occluded segment of the biliary tree. In the future, biliary drainage catheters may need to be modified with additional sideholes to provide more broad coverage. Electronically Signed   By: Jacqulynn Cadet M.D.   On: 07/10/2021 15:55   IR INT EXT BILIARY DRAIN WITH CHOLANGIOGRAM  Result Date: 07/10/2021 INDICATION: 70 year old male with evidence of cirrhosis, significant intrahepatic biliary ductal dilatation with biliary occlusion at the intrahepatic hilum, obstructed jaundice and a possible infiltrative mass within hepatic segment 8. Patient presents today for placement a percutaneous biliary drainage catheter, attempted internalization past the central obstruction, brush  biopsy and also potentially ultrasound-guided core biopsy of the suspected hepatic parenchymal mass. EXAM: 1. Percutaneous  transhepatic cholangiogram 2. Transhepatic brush biopsy of biliary lesion 3. Successful crossing of biliary stenosis and placement of internal/external percutaneous biliary drainage catheter 4. Ultrasound-guided core biopsy of ill-defined masslike region in hepatic segment 8 MEDICATIONS: 2 g cefoxitin; The antibiotic was administered within an appropriate time frame prior to the initiation of the procedure. ANESTHESIA/SEDATION: Moderate (conscious) sedation was employed during this procedure. A total of Versed 4 mg and Fentanyl 200 mcg was administered intravenously. Moderate Sedation Time: 49 minutes. The patient's level of consciousness and vital signs were monitored continuously by radiology nursing throughout the procedure under my direct supervision. FLUOROSCOPY TIME:  Fluoroscopy Time: 12 minutes 24 seconds (143 mGy). COMPLICATIONS: None immediate. PROCEDURE: Informed written consent was obtained from the patient after a thorough discussion of the procedural risks, benefits and alternatives. All questions were addressed. Maximal Sterile Barrier Technique was utilized including caps, mask, sterile gowns, sterile gloves, sterile drape, hand hygiene and skin antiseptic. A timeout was performed prior to the initiation of the procedure. Ultrasound was used to interrogate the left hemi liver. Biliary ductal dilatation is identified. A suitable skin entry site was selected and marked. Local anesthesia was attained by infiltration with 1% lidocaine. A small dermatotomy was made. Using a 21 gauge Chiba needle, the needle was carefully advanced through the left lobe of the liver and used to puncture the dilated bile duct. A transhepatic cholangiogram was then performed. This demonstrates successful access of a tertiary biliary radicle. There is marked intrahepatic biliary ductal dilatation. A large filling defect is present within the hepatic hilum extending into the common hepatic duct. Ultimately, a 0.018 microwire  was advanced centrally within the biliary tree. The access needle was removed. The Accustick sheath was advanced over the wire and into the left main hepatic duct. Additional cholangiography was performed. There is no cross-filling into the right intrahepatic ducts. The cystic duct is patent. The common bile duct is patent. A road runner wire was used to successfully traverse the obstructing mass at the hepatic confluence. The Accustick sheath was then removed. The tract was serially dilated to 10 [place]. A 6 French vascular sheath was advanced over the wire and positioned adjacent to the obstructing mass in the common hepatic duct. Brush biopsies were then obtained. The 6 French sheath was then removed. A 10 [place] biliary drainage catheter was then advanced over the wire, past the occlusion and into the duodenum. The distal loop was reconstituted in the duodenum. Contrast injection confirms adequate placement of the catheter with persistent filling of the dilated intrahepatic left ducts and passage of contrast material into the duodenum. The catheter was secured to the skin with 0 Prolene suture. Attention was next turned to the right upper quadrant. The right upper quadrant was interrogated with ultrasound. A region of heterogeneity in hepatic segment 8 was identified. This appears to correspond with the suspected mass lesion on prior CT and MRI imaging. Local anesthesia was again attained by infiltration with 1% lidocaine. A small dermatotomy was made. Under real-time ultrasound guidance, a 17 gauge introducer needle was advanced into the liver. Multiple 18 gauge core biopsies were then coaxially obtained using the bio Pince automated biopsy device. Biopsy specimens were placed in formalin and delivered to pathology for further analysis. As the introducer needle was removed, the biopsy tract was embolized with a Gel-Foam slurry. IMPRESSION: 1. Successful percutaneous transhepatic cholangiogram demonstrating an  obstructing mass at  the intrahepatic confluence extending into the common hepatic duct. 2. Brush biopsy of intra biliary mass. 3. Successful placement of a 10 French internal/external biliary drainage catheter. 4. Ultrasound-guided core biopsy of region of ill-defined heterogeneity in segment 8 of the liver. PLAN: 1. Maintain internal/external biliary drainage catheter to bag until bilirubin has decreased adequately. Capping may then be attempted. 2. Due to underlying cirrhosis and hepatomegaly, the existing standard internal/external biliary drainage catheter just covers the occluded segment of the biliary tree. In the future, biliary drainage catheters may need to be modified with additional sideholes to provide more broad coverage. Electronically Signed   By: Jacqulynn Cadet M.D.   On: 07/10/2021 15:55   IR ENDOLUMINAL BX OF BILIARY TREE  Result Date: 07/10/2021 INDICATION: 70 year old male with evidence of cirrhosis, significant intrahepatic biliary ductal dilatation with biliary occlusion at the intrahepatic hilum, obstructed jaundice and a possible infiltrative mass within hepatic segment 8. Patient presents today for placement a percutaneous biliary drainage catheter, attempted internalization past the central obstruction, brush biopsy and also potentially ultrasound-guided core biopsy of the suspected hepatic parenchymal mass. EXAM: 1. Percutaneous transhepatic cholangiogram 2. Transhepatic brush biopsy of biliary lesion 3. Successful crossing of biliary stenosis and placement of internal/external percutaneous biliary drainage catheter 4. Ultrasound-guided core biopsy of ill-defined masslike region in hepatic segment 8 MEDICATIONS: 2 g cefoxitin; The antibiotic was administered within an appropriate time frame prior to the initiation of the procedure. ANESTHESIA/SEDATION: Moderate (conscious) sedation was employed during this procedure. A total of Versed 4 mg and Fentanyl 200 mcg was administered  intravenously. Moderate Sedation Time: 49 minutes. The patient's level of consciousness and vital signs were monitored continuously by radiology nursing throughout the procedure under my direct supervision. FLUOROSCOPY TIME:  Fluoroscopy Time: 12 minutes 24 seconds (143 mGy). COMPLICATIONS: None immediate. PROCEDURE: Informed written consent was obtained from the patient after a thorough discussion of the procedural risks, benefits and alternatives. All questions were addressed. Maximal Sterile Barrier Technique was utilized including caps, mask, sterile gowns, sterile gloves, sterile drape, hand hygiene and skin antiseptic. A timeout was performed prior to the initiation of the procedure. Ultrasound was used to interrogate the left hemi liver. Biliary ductal dilatation is identified. A suitable skin entry site was selected and marked. Local anesthesia was attained by infiltration with 1% lidocaine. A small dermatotomy was made. Using a 21 gauge Chiba needle, the needle was carefully advanced through the left lobe of the liver and used to puncture the dilated bile duct. A transhepatic cholangiogram was then performed. This demonstrates successful access of a tertiary biliary radicle. There is marked intrahepatic biliary ductal dilatation. A large filling defect is present within the hepatic hilum extending into the common hepatic duct. Ultimately, a 0.018 microwire was advanced centrally within the biliary tree. The access needle was removed. The Accustick sheath was advanced over the wire and into the left main hepatic duct. Additional cholangiography was performed. There is no cross-filling into the right intrahepatic ducts. The cystic duct is patent. The common bile duct is patent. A road runner wire was used to successfully traverse the obstructing mass at the hepatic confluence. The Accustick sheath was then removed. The tract was serially dilated to 10 [place]. A 6 French vascular sheath was advanced over the  wire and positioned adjacent to the obstructing mass in the common hepatic duct. Brush biopsies were then obtained. The 6 French sheath was then removed. A 10 [place] biliary drainage catheter was then advanced over the wire, past the  occlusion and into the duodenum. The distal loop was reconstituted in the duodenum. Contrast injection confirms adequate placement of the catheter with persistent filling of the dilated intrahepatic left ducts and passage of contrast material into the duodenum. The catheter was secured to the skin with 0 Prolene suture. Attention was next turned to the right upper quadrant. The right upper quadrant was interrogated with ultrasound. A region of heterogeneity in hepatic segment 8 was identified. This appears to correspond with the suspected mass lesion on prior CT and MRI imaging. Local anesthesia was again attained by infiltration with 1% lidocaine. A small dermatotomy was made. Under real-time ultrasound guidance, a 17 gauge introducer needle was advanced into the liver. Multiple 18 gauge core biopsies were then coaxially obtained using the bio Pince automated biopsy device. Biopsy specimens were placed in formalin and delivered to pathology for further analysis. As the introducer needle was removed, the biopsy tract was embolized with a Gel-Foam slurry. IMPRESSION: 1. Successful percutaneous transhepatic cholangiogram demonstrating an obstructing mass at the intrahepatic confluence extending into the common hepatic duct. 2. Brush biopsy of intra biliary mass. 3. Successful placement of a 10 French internal/external biliary drainage catheter. 4. Ultrasound-guided core biopsy of region of ill-defined heterogeneity in segment 8 of the liver. PLAN: 1. Maintain internal/external biliary drainage catheter to bag until bilirubin has decreased adequately. Capping may then be attempted. 2. Due to underlying cirrhosis and hepatomegaly, the existing standard internal/external biliary drainage  catheter just covers the occluded segment of the biliary tree. In the future, biliary drainage catheters may need to be modified with additional sideholes to provide more broad coverage. Electronically Signed   By: Jacqulynn Cadet M.D.   On: 07/10/2021 15:55   US Abdomen Limited RUQ (LIVER/GB)  Result Date: 07/11/2021 CLINICAL DATA:  70 year old male with history of cirrhosis and recently diagnosed infiltrative hepatic mass, postprocedure day #1 after left-sided percutaneous internal external biliary drain, now with diffuse severe abdominal pain. EXAM: ULTRASOUND ABDOMEN LIMITED RIGHT UPPER QUADRANT COMPARISON:  07/10/2021, 07/07/2021 FINDINGS: Gallbladder: Scattered echogenic gallstones, the largest measuring up to 8 mm. No gallbladder distension, significant wall thickening, or pericholecystic fluid. No sonographic Percell Miller sign reported. Common bile duct: Not visualized. Liver: Mild diffuse intrahepatic biliary ductal dilation, equal in the right and left lobes. The known hepatic mass is not well appreciated sonographically. Heterogeneous hepatic parenchyma with nodular contour. Portal vein is patent on color Doppler imaging with normal direction of blood flow towards the liver. Other: No perihepatic ascites. IMPRESSION: 1. No acute right upper quadrant abnormality. 2. Similar to slightly decreased mild diffuse intrahepatic biliary ductal dilation from MRI comparison after left-sided biliary drain placement. 3. The known infiltrative hepatic mass in segment 8 is not well demonstrated sonographically. 4. Cholelithiasis. Ruthann Cancer, MD [facility] Electronically Signed   By: Ruthann Cancer M.D.   On: 07/11/2021 16:33    Labs:  CBC: Recent Labs    07/10/21 0123 07/11/21 0109 07/11/21 1414 07/12/21 0138  WBC 4.6 7.2 10.4 11.3*  HGB 14.6 16.1 17.9* 18.1*  HCT 42.5 47.5 51.7 52.8*  PLT 79* 123* 147* 149*    COAGS: Recent Labs     07/06/21 2329 07/08/21 0117  INR 1.1 1.0  APTT 31  --     BMP: Recent Labs    07/10/21 0123 07/11/21 0109 07/11/21 1414 07/12/21 0138  NA 137 134* 134* 134*  K 3.6 4.1 4.0 5.0  CL 105 102 103 99  CO2 25 25  21* 23  GLUCOSE 136* 174* 131* 120*  BUN 13 22 30* 37*  CALCIUM 8.7* 9.4 9.8 10.1  CREATININE 0.85 1.01 1.26* 2.82*  GFRNONAA >60 >60 >60 24*    LIVER FUNCTION TESTS: Recent Labs    07/10/21 0123 07/11/21 0109 07/11/21 1414 07/12/21 0138  BILITOT 5.7* 5.0* 5.2* 4.8*  AST 100* 123* 115* 100*  ALT 107* 126* 125* 117*  ALKPHOS 256* 271* 277* 271*  PROT 6.6 7.5 8.5* 8.6*  ALBUMIN 2.7* 3.1* 3.4* 3.4*    Assessment and Plan:  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 Laurence Ferrari  Brush biopsy results - no malignant cells identified.  Liver biopsy results - adenocarcinoma; findings consistent with cholangiocarcinoma.   The abdominal pain the patient was experiencing yesterday afternoon has resolved. Hepatic ultrasound showed no acute abnormality and similar to slightly decreased intrahepatic biliary ductal dilation. The order for the cholangiogram has been cancelled by IR.   Drain output since 0700 is 1425 ml. He remains afebrile, T. Bili has decreased to 4.8. WBC is 11.3 today.  Continue flushing drain and documenting output each shift. Change the dressing daily or as needed. Other plans per primary teams. IR will continue to follow.    Electronically Signed: Soyla Dryer, AGACNP-BC [phone] 07/12/2021, 1:53 PM   I spent a total of 15 Minutes at the the patient's bedside AND on the patient's hospital floor or unit, greater than 50% of which was counseling/coordinating care for biliary drain.

## 2021-07-12 NOTE — Consult Note (Signed)
[facility] Renal Consultation Note  Requesting MD: Vernell Leep, MD Indication for Consultation:  AKI   Chief complaint: "turned yellow"  HPI:  Phillip Saunders is a 70 y.o. male with a history of HTN and DM and primary hyperparathyroidism who presented to the hospital with painless jaundice "he just turned yellow".  Other associated concerning symptoms are weight loss.  He was found to have cirrhosis and moderate intrahepatic biliary diltation as well as a large hepatic mass and findings c/w portal HTN.  He had a biliary drain placed on 8/23 with IR.  He had nausea/vomiting yesterday and abd pain and this has resolved today.  He had high drain output post procedure.  His drain had 4 liters out on 8/23, 5.2 liters out on 8/24 and 1.4 liters thus far charted over 8/25.  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  His course has also been complicated by AKI.  Cr trends as below.  Nephrology is consulted for assistance with management of the same.  Blood pressure has been running low 100's or mid [id number] systolic recently and he has been on irbesartan 150 mg daily.  No urine output is available for 8/24 and strict ins/outs are not recorded.  Contrasted CT was several days ago - 8/19.  Kidneys with no hydro on that scan and subcentimeter hypodense lesions too small to further characterize.  He denies any family hx of CKD. He denies home NSAID use.  His wife is at bedside and supplements history.   Creatinine, Ser  Date/Time Value Ref Range Status  07/12/2021 01:38 AM 2.82 (H) 0.61 - 1.24 mg/dL Final    Comment:    DELTA CHECK NOTED  07/11/2021 02:14 PM 1.26 (H) 0.61 - 1.24 mg/dL Final  07/11/2021 01:09 AM 1.01 0.61 - 1.24 mg/dL Final  07/10/2021 01:23 AM 0.85 0.61 - 1.24 mg/dL Final  07/09/2021 12:55 AM 0.74 0.61 - 1.24 mg/dL Final  07/08/2021 01:17 AM 0.83 0.61 - 1.24 mg/dL Final  07/07/2021 02:05 AM 0.82 0.61 - 1.24 mg/dL  Final  07/06/2021 07:25 PM 0.85 0.61 - 1.24 mg/dL Final     PMHx:   Past Medical History:  Diagnosis Date   DM2 (diabetes mellitus, type 2) ([place])    Hypertension    Primary hyperparathyroidism ([place])     Past Surgical History:  Procedure Laterality Date   APPENDECTOMY     HERNIA REPAIR     IR ENDOLUMINAL BX OF BILIARY TREE  07/10/2021   IR INT EXT BILIARY DRAIN WITH CHOLANGIOGRAM  07/10/2021   IR US GUIDE BX ASP/DRAIN  07/10/2021   PARATHYROIDECTOMY     VASECTOMY      Family Hx:  Family History  Problem Relation Age of Onset   Pancreatic cancer Neg Hx   No family hx of ESRD  Social History:  reports that he has never smoked. He has never used smokeless tobacco. He reports that he does not currently use alcohol. He reports that he does not use drugs.  Allergies: No Known Allergies  Medications: Prior to Admission medications   Medication Sig Start Date End Date Taking? Authorizing Provider  atorvastatin (LIPITOR) 40 MG tablet Take 40 mg by mouth at bedtime. 03/22/16  Yes [provider]  irbesartan (AVAPRO) 150 MG tablet Take 150 mg by mouth daily. 05/07/21  Yes [provider]  metFORMIN (GLUCOPHAGE) 1000 MG tablet Take 1,000 mg by mouth 2 (two) times daily with a meal. 06/21/21  Yes [provider]  RYBELSUS 7 MG TABS Take 7 mg by mouth every morning. 06/25/21  Yes [provider]    I have reviewed the patient's current and reported prior to admission medications.  Labs:  BMP Latest Ref Rng & Units 07/12/2021 07/11/2021 07/11/2021  Glucose 70 - 99 mg/dL 120(H) 131(H) 174(H)  BUN 8 - 23 mg/dL 37(H) 30(H) 22  Creatinine 0.61 - 1.24 mg/dL 2.82(H) 1.26(H) 1.01  Sodium 135 - 145 mmol/L 134(L) 134(L) 134(L)  Potassium 3.5 - 5.1 mmol/L 5.0 4.0 4.1  Chloride 98 - 111 mmol/L 99 103 102  CO2 22 - 32 mmol/L 23 21(L) 25  Calcium 8.9 - 10.3 mg/dL 10.1 9.8 9.4    Urinalysis    Component Value Date/Time   COLORURINE AMBER (A) 07/06/2021 2000    APPEARANCEUR CLEAR 07/06/2021 2000   LABSPEC 1.039 (H) 07/06/2021 2000   PHURINE 5.0 07/06/2021 2000   GLUCOSEU NEGATIVE 07/06/2021 2000   HGBUR NEGATIVE 07/06/2021 2000   BILIRUBINUR SMALL (A) 07/06/2021 2000   KETONESUR NEGATIVE 07/06/2021 2000   PROTEINUR NEGATIVE 07/06/2021 2000   NITRITE NEGATIVE 07/06/2021 2000   LEUKOCYTESUR TRACE (A) 07/06/2021 2000     ROS:  Pertinent items noted in HPI and remainder of comprehensive ROS otherwise negative.  Physical Exam: Vitals:   07/12/21 0732 07/12/21 1155  BP: 113/81 106/78  Pulse: 74 71  Resp: 17 18  Temp: 98.1 F (36.7 C) 98.1 F (36.7 C)  SpO2: 93% 93%     General: adult male in bed in NAD HEENT: NCAT  Eyes: EOMI icteric sclera Neck: supple trachea midline Heart: S1S2 no rub Lungs: Clear to auscultation; room air and unlabored at rest Abdomen: distended; nontender for me.  With abd drain in place Extremities: no edema appreciated; no cyanosis or clubbing Skin: jaundice; no rash on extremities exposed  Psych normal mood and affect Neuro: alert and oriented x 3 provides hx and follows commands  Assessment/Plan:  # AKI  - Secondary to ischemic and pre-renal insults with n/v, high drain output in setting of soft blood pressures and irbesartan use.  UA earlier this admission neg protein, elevated specific gravity, and 0-5 RBC.  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

## 2021-07-12 NOTE — Progress Notes (Addendum)
[facility] Progress Note  CC:  abdominal pain  Assessment / Plan: New diagnosis of cirrhosis and cholangiocarcinoma s/p PTC and placement of percutaneous int/ext biliary drain 07/10/21. Developed severe abdominal pain almost 24 hour after the procedure that resolved without intervention. Lipase was elevated but symptoms improved as abruptly as they started. Cause of pancreatitis is unclear but likely related to the procedure. High biliary drain output has improved. Liver enzymes and lipase are now improving.  Advance diet as tolerated. Continue IV hydration.   Biopsy pathology resulted today with the mass shows adenocarcinoma. Patient appropriately concerned about the results. Awaiting Oncology consultation and recommendations for staging.    The inpatient GI service will move to stand-by. Please call the on-call gastroenterologist with any additional questions or concerns during this hospitalization.     Subjective: Pain acutely resolved last night without intervention. Drain output has significantly decreased. Today, much more concerned about diagnosis, treatment options, and prognosis. Wife is present at the bedside.   Objective:  Vital signs in last 24 hours: Temp:  [97.8 F (36.6 C)-98.1 F (36.7 C)] 98.1 F (36.7 C) (08/25 1155) Pulse Rate:  [63-74] 71 (08/25 1155) Resp:  [16-18] 18 (08/25 1155) BP: (94-113)/(74-81) 106/78 (08/25 1155) SpO2:  [91 %-93 %] 93 % (08/25 1155) Last BM Date: 07/11/21 General:   Alert, in NAD, much more comfortable today Heart:  Regular rate and rhythm; no murmurs Pulm: Clear anteriorly; no wheezing Abdomen:  Soft. Nontender. Nondistended. Normal bowel sounds. No rebound or guarding. Drain present in the midabdomen with bilious output in the drain. LAD: No inguinal or umbilical LAD Extremities:  Without edema. Neurologic:  Alert and  oriented x4;  grossly normal neurologically. Psych:  Alert and cooperative. Normal mood and  affect.  Intake/Output from previous day: 08/24 0701 - 08/25 0700 In: 2875 [P.O.:460; I.V.:2405] Out: 5225 [Drains:5225] Intake/Output this shift: Total I/O In: -  Out: 1425 [Drains:1425]  Lab Results: Recent Labs    07/11/21 0109 07/11/21 1414 07/12/21 0138  WBC 7.2 10.4 11.3*  HGB 16.1 17.9* 18.1*  HCT 47.5 51.7 52.8*  PLT 123* 147* 149*   BMET Recent Labs    07/11/21 0109 07/11/21 1414 07/12/21 0138  NA 134* 134* 134*  K 4.1 4.0 5.0  CL 102 103 99  CO2 25 21* 23  GLUCOSE 174* 131* 120*  BUN 22 30* 37*  CREATININE 1.01 1.26* 2.82*  CALCIUM 9.4 9.8 10.1   LFT Recent Labs    07/12/21 0138  PROT 8.6*  ALBUMIN 3.4*  AST 100*  ALT 117*  ALKPHOS 271*  BILITOT 4.8*     US Abdomen Limited RUQ (LIVER/GB)  Result Date: 07/11/2021 CLINICAL DATA:  70 year old male with history of cirrhosis and recently diagnosed infiltrative hepatic mass, postprocedure day #1 after left-sided percutaneous internal external biliary drain, now with diffuse severe abdominal pain. EXAM: ULTRASOUND ABDOMEN LIMITED RIGHT UPPER QUADRANT COMPARISON:  07/10/2021, 07/07/2021 FINDINGS: Gallbladder: Scattered echogenic gallstones, the largest measuring up to 8 mm. No gallbladder distension, significant wall thickening, or pericholecystic fluid. No sonographic Percell Miller sign reported. Common bile duct: Not visualized. Liver: Mild diffuse intrahepatic biliary ductal dilation, equal in the right and left lobes. The known hepatic mass is not well appreciated sonographically. Heterogeneous hepatic parenchyma with nodular contour. Portal vein is patent on color Doppler imaging with normal direction of blood flow towards the liver. Other: No perihepatic ascites. IMPRESSION: 1. No acute right upper quadrant abnormality. 2. Similar to slightly decreased mild diffuse intrahepatic  biliary ductal dilation from MRI comparison after left-sided biliary drain placement. 3. The known infiltrative hepatic mass in segment 8  is not well demonstrated sonographically. 4. Cholelithiasis. Ruthann Cancer, MD [facility] Radiology Electronically Signed   By: Ruthann Cancer M.D.   On: 07/11/2021 16:33    A   LOS: 5 days   [place]  07/12/2021, 1:49 PM

## 2021-07-13 ENCOUNTER — Inpatient Hospital Stay (HOSPITAL_COMMUNITY): Payer: Medicare HMO

## 2021-07-13 LAB — COMPREHENSIVE METABOLIC PANEL
ALT: 91 U/L — ABNORMAL HIGH (ref 0–44)
AST: 77 U/L — ABNORMAL HIGH (ref 15–41)
Albumin: 3.2 g/dL — ABNORMAL LOW (ref 3.5–5.0)
Alkaline Phosphatase: 234 U/L — ABNORMAL HIGH (ref 38–126)
Anion gap: 11 (ref 5–15)
BUN: 50 mg/dL — ABNORMAL HIGH (ref 8–23)
CO2: 16 mmol/L — ABNORMAL LOW (ref 22–32)
Calcium: 9.7 mg/dL (ref 8.9–10.3)
Chloride: 105 mmol/L (ref 98–111)
Creatinine, Ser: 1.79 mg/dL — ABNORMAL HIGH (ref 0.61–1.24)
GFR, Estimated: 41 mL/min — ABNORMAL LOW (ref >60)
Glucose, Bld: 129 mg/dL — ABNORMAL HIGH (ref 70–99)
Potassium: 4.4 mmol/L (ref 3.5–5.1)
Sodium: 132 mmol/L — ABNORMAL LOW (ref 135–145)
Total Bilirubin: 4.8 mg/dL — ABNORMAL HIGH (ref 0.3–1.2)
Total Protein: 8.5 g/dL — ABNORMAL HIGH (ref 6.5–8.1)

## 2021-07-13 LAB — CBC
HCT: 51.9 % (ref 39.0–52.0)
Hemoglobin: 18 g/dL — ABNORMAL HIGH (ref 13.0–17.0)
MCH: 32.7 pg (ref 26.0–34.0)
MCHC: 34.7 g/dL (ref 30.0–36.0)
MCV: 94.4 fL (ref 80.0–100.0)
Platelets: 169 10*3/uL (ref 150–400)
RBC: 5.5 MIL/uL (ref 4.22–5.81)
RDW: 14.5 % (ref 11.5–15.5)
WBC: 13.5 10*3/uL — ABNORMAL HIGH (ref 4.0–10.5)
nRBC: 0 % (ref 0.0–0.2)

## 2021-07-13 LAB — LIPASE, BLOOD: Lipase: 771 U/L — ABNORMAL HIGH (ref 11–51)

## 2021-07-13 LAB — GLUCOSE, CAPILLARY
Glucose-Capillary: 116 mg/dL — ABNORMAL HIGH (ref 70–99)
Glucose-Capillary: 125 mg/dL — ABNORMAL HIGH (ref 70–99)
Glucose-Capillary: 140 mg/dL — ABNORMAL HIGH (ref 70–99)
Glucose-Capillary: 177 mg/dL — ABNORMAL HIGH (ref 70–99)
Glucose-Capillary: 187 mg/dL — ABNORMAL HIGH (ref 70–99)

## 2021-07-13 NOTE — Progress Notes (Addendum)
PROGRESS NOTE    Phillip Saunders  [id number] DOB: 1951-05-22 DOA: 07/06/2021 PCP: Algis Greenhouse, MD    Brief Narrative:  70 year old male with medical history significant for type 2 diabetes mellitus, hypertension, presented to the ED with complaints of nausea and vomiting x4 days and admitted with painless jaundice and unintentional weight loss.  Found to have cirrhosis with portal hypertension and a large hepatic mass worrisome for malignancy.  ERCP for biliary decompression not likely to be successful for biliary stenting.  IR performed percutaneous cholangiogram with placement of percutaneous biliary drain 8/23.  Postprocedure course complicated by severe abdominal pain and high output biliary drain.  GI and IR reevaluating.  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  Oncology consulted.  Developed AKI, nephrology consulted.  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 CT abdomen shows obstructive pathology likely secondary to malignancy seen on MRI. Elevated tumor markers AFP 5.9, CA 19-9 417, CEA 8.1. Cirrhosis etiology unclear, denies any EtOH intake, NASH, autoimmune. Acute hepatitis panel negative. Ammonia 76,  MRCP: Cirrhosis with concerns of infiltrative hepatocellular carcinoma, right portal vein tumor. [facility] was consulted and indicated that ERCP for biliary decompression not likely to be successful for biliary stenting. IR then performed percutaneous  cholangiogram, brush biopsy of obstructing intrabiliary lesion, placement of 10 French internal/external biliary drain and US guided biopsy of hepatic mass on 07/10/2021 Pathology confirmed adenocarcinoma/cholangiocarcinoma.  Oncology was consulted on 8/24. Postprocedure 8/24, high-volume biliary drain output and progressively worsening abdominal pain.  Likely related to postprocedure acute pancreatitis. Communicated extensively with Dr. Tarri Glenn, [facility].  Abdominal ultrasound without procedure related complications.  IV fluids increased to 200 mL/mL due to high volume drain output. Oncology has consulted.  CT chest without contrast shows no pulmonary metastatic disease.  Chemotherapy as outpatient. Drain output has decreased, 3350 yesterday.  625 mL thus far. LFTs continue to improve.  Acute pancreatitis: ?  Related to biliary procedure above Lipase 3155 > 1833 >771. Abdominal pain resolved.  Tolerated full liquid diet.  Advanced to soft low-fat diet.  Acute kidney injury/dehydration/hemoconcentration/NAG metabolic acidosis: Creatinine abruptly increased from 1.26-2.8 Secondary to high-volume drain output in the setting of soft blood pressures, ARB use. Nephrology consulted.  ARB discontinued.  IV fluids reduced to 150 mL/h.  Avoid hypotension. Creatinine is improved to 1.79.  Bicarb 16.  Normal anion gap.   Per nephrology, DC IVF, encourage oral intake to exceed his outputs, if improving of IVF's tomorrow then cleared for DC from their standpoint.  Thrombocytopenia: Resolved.  Nausea and vomiting: Resolved.  Essential hypertension : ARB held due to AKI.  Blood pressures reasonable.  Type 2 diabetes: Mildly uncontrolled and fluctuating.  Continue current SSI.  Metformin on hold.  Hyperlipidemia : Continue statin.  Cirrhosis: New diagnosis.  GI on board.  DVT prophylaxis: Lovenox Code Status: Full code Family Communication: Discussed in detail with patient's wife at  bedside today, updated care and answered all questions Disposition Plan:   Status is: Inpatient  Remains inpatient appropriate because:Inpatient level of care appropriate due to severity of illness  Dispo: The patient is from: Home              Anticipated d/c is to: Home              Patient currently is not medically stable to d/c.   Difficult to place patient No  Consultants:  GI IR Nephrology Oncology  Procedures:  Antimicrobials:   Anti-infectives (From admission, onward)    Start     Dose/Rate Route Frequency Ordered Stop   07/10/21 0800  cefOXitin (MEFOXIN) 2 g in sodium chloride 0.9 % 100 mL IVPB        2 g 200 mL/hr over 30 Minutes Intravenous To Radiology 07/09/21 1407 07/10/21 1216       Subjective: No abdominal pain.  Tolerating full liquid diet.  No nausea or vomiting.  Last BM was yesterday.  Had some back pain overnight due to uncomfortable bed and got some meds for same which has since resolved.  Ambulating well in the room.  Today is his grandson's 10th birthday, although unhappy, understands reason that he is not ready for discharge yet  Objective: Vitals:   07/12/21 1649 07/12/21 1934 07/13/21 0010 07/13/21 0359  BP: 110/62 124/85 104/83 128/90  Pulse:  78 100 76  Resp:  '16 18 20  '$ Temp:  99.3 F (37.4 C) 98.6 F (37 C) 99.1 F (37.3 C)  TempSrc:  Oral Oral Oral  SpO2:  94% 95% (!) 84%  Weight:      Height:        Intake/Output Summary (Last 24 hours) at 07/13/2021 1503 Last data filed at 07/13/2021 1259 Gross per 24 hour  Intake 490 ml  Output 2975 ml  Net -2485 ml   Filed Weights   07/06/21 1857  Weight: 88.9 kg    Examination:  General exam: Middle-age male, moderately built and nourished lying comfortably propped up in bed without distress.  Scleral and skin icterus.  Oral mucosa moist. Respiratory system: Clear to auscultation.  No increased work of breathing. Cardiovascular system: S1 & S2 heard, RRR. No JVD, murmurs, rubs, gallops  or clicks. No pedal edema. Gastrointestinal system: Abdomen is nondistended, soft and nontender.  Normal bowel sounds heard.  Percutaneous biliary drain site intact Central nervous system: Alert and oriented. No focal neurological deficits. Extremities: Symmetric 5 x 5 power. Skin: No rashes, lesions or ulcers Psychiatry: Judgement and insight appear normal. Mood & affect appropriate.      Data Reviewed: I have personally reviewed following labs and imaging studies  CBC: Recent Labs  Lab 07/06/21 1925 07/07/21 0205 07/10/21 0123 07/11/21 0109 07/11/21 1414 07/12/21 0138 07/13/21 0144  WBC 5.2   < > 4.6 7.2 10.4 11.3* 13.5*  NEUTROABS 3.8  --   --   --   --   --   --   HGB 14.7   < > 14.6 16.1 17.9* 18.1* 18.0*  HCT 43.1   < > 42.5 47.5 51.7 52.8* 51.9  MCV 96.0   < > 94.2 94.8 93.7 95.5 94.4  PLT 90*   < > 79* 123* 147* 149* 169   < > = values in this interval not displayed.   Basic Metabolic Panel: Recent Labs  Lab 07/08/21 0117 07/09/21 0055 07/10/21 0123 07/11/21 0109 07/11/21 1414 07/12/21 0138 07/13/21 0144  NA 135 135 137 134* 134* 134* 132*  K 3.5 3.6 3.6 4.1 4.0 5.0 4.4  CL 103 105 105 102 103 99 105  CO2 '27 24 25 25 '$ 21* 23 16*  GLUCOSE 186* 152* 136* 174* 131* 120* 129*  BUN '12 10 13 22 '$ 30* 37* 50*  CREATININE 0.83 0.74 0.85 1.01 1.26* 2.82* 1.79*  CALCIUM 8.2* 8.5* 8.7* 9.4 9.8 10.1 9.7  MG 2.0 1.9 1.9 2.2  --  2.5*  --    GFR: Estimated Creatinine Clearance: 38.9 mL/min (A) (by C-G formula based on SCr of 1.79 mg/dL (H)). Liver Function Tests: Recent Labs  Lab 07/10/21 0123 07/11/21 0109 07/11/21 1414 07/12/21 0138 07/13/21 0144  AST 100* 123* 115* 100* 77*  ALT 107* 126* 125* 117* 91*  ALKPHOS 256* 271* 277* 271* 234*  BILITOT 5.7* 5.0* 5.2* 4.8* 4.8*  PROT 6.6 7.5 8.5* 8.6* 8.5*  ALBUMIN 2.7* 3.1* 3.4* 3.4* 3.2*   Recent Labs  Lab 07/06/21 1925 07/11/21 1414 07/12/21 0138 07/13/21 0144  LIPASE 51 3,155* 1,833* 771*   Recent Labs   Lab 07/06/21 2329  AMMONIA 76*   Coagulation Profile: Recent Labs  Lab 07/06/21 2329 07/08/21 0117  INR 1.1 1.0   CBG: Recent Labs  Lab 07/12/21 1627 07/12/21 2010 07/13/21 0011 07/13/21 0824 07/13/21 1129  GLUCAP 116* 188* 125* 140* 177*   Recent Results (from the past 240 hour(s))  Resp Panel by RT-PCR (Flu A&B, Covid) Nasopharyngeal Swab     Status: None   Collection Time: 07/06/21  7:15 PM   Specimen: Nasopharyngeal Swab; Nasopharyngeal(NP) swabs in vial transport medium  Result Value Ref Range Status   SARS Coronavirus 2 by RT PCR NEGATIVE NEGATIVE Final    Comment: (NOTE) SARS-CoV-2 target nucleic acids are NOT DETECTED.  The SARS-CoV-2 RNA is generally detectable in upper respiratory specimens during the acute phase of infection. The lowest concentration of SARS-CoV-2 viral copies this assay can detect is 138 copies/mL. A negative result does not preclude SARS-Cov-2 infection and should not be used as the sole basis for treatment or other patient management decisions. A negative result may occur with  improper specimen collection/handling, submission of specimen other than nasopharyngeal swab, presence of viral mutation(s) within the areas targeted by this assay, and inadequate number of viral copies(<138 copies/mL). A negative result must be combined with clinical observations, patient history, and epidemiological information. The expected result is Negative.  Fact Sheet for Patients:  EntrepreneurPulse.com.au  Fact Sheet for Healthcare Providers:  IncredibleEmployment.be  This test is no t yet approved or cleared by the [place] FDA and  has been authorized for detection and/or diagnosis of SARS-CoV-2 by FDA under an Emergency Use Authorization (EUA). This EUA will remain  in effect (meaning this test can be used) for the duration of the COVID-19 declaration under Section 564(b)(1) of the Act, 21 U.S.C.section  360bbb-3(b)(1), unless the authorization is terminated  or revoked sooner.       Influenza A by PCR NEGATIVE NEGATIVE Final   Influenza B by PCR NEGATIVE NEGATIVE Final    Comment: (NOTE) The Xpert Xpress SARS-CoV-2/FLU/RSV plus assay is intended as an aid in the diagnosis of influenza from Nasopharyngeal swab specimens and should not be used as a sole basis for treatment. Nasal washings and aspirates are unacceptable for Xpert Xpress SARS-CoV-2/FLU/RSV testing.  Fact Sheet for Patients: EntrepreneurPulse.com.au  Fact Sheet for Healthcare Providers: IncredibleEmployment.be  This test is not yet approved or cleared by the [place] FDA and has been authorized for detection and/or diagnosis of SARS-CoV-2 by FDA under an Emergency Use Authorization (EUA).  This EUA will remain in effect (meaning this test can be used) for the duration of the COVID-19 declaration under Section 564(b)(1) of the Act, 21 U.S.C. section 360bbb-3(b)(1), unless the authorization is terminated or revoked.  Performed at [facility], [place] [address] [place], [place] [address]       Radiology Studies: CT CHEST WO CONTRAST  Result Date: 07/13/2021 CLINICAL DATA:  70 year old male with history of liver mass. Staging examination. EXAM: CT CHEST WITHOUT CONTRAST TECHNIQUE: Multidetector CT imaging of the chest was performed following the standard protocol without IV contrast. COMPARISON:  Chest CT 01/25/2011. FINDINGS: Cardiovascular: Heart size is normal. There is no significant pericardial fluid, thickening or pericardial calcification. There is aortic atherosclerosis, as well as atherosclerosis of the great vessels of the mediastinum and the coronary arteries, including calcified atherosclerotic plaque in the left anterior descending and right coronary arteries. Ectasia of ascending thoracic aorta (4.4 cm in diameter). Mediastinum/Nodes: No pathologically enlarged  mediastinal or hilar lymph nodes. Please note that accurate exclusion of hilar adenopathy is limited on noncontrast CT scans. Lungs/Pleura: No suspicious appearing pulmonary nodules or masses are noted. No acute consolidative airspace disease. No pleural effusions. Linear areas of subsegmental atelectasis or scarring are noted in the lower lobes of the lungs bilaterally. Upper Abdomen: Percutaneous biliary drain incompletely imaged. Liver has a shrunken appearance and nodular contour, indicative of advanced cirrhosis. Diffuse low attenuation throughout the visualized hepatic parenchyma, indicative of a background of hepatic steatosis. Small amount of pneumobilia in the left lobe of the liver. Previously suspected hepatic mass not confidently identified on today's noncontrast CT examination. Soft tissue stranding in the retroperitoneum adjacent to the pancreas. 2 mm nonobstructive calculus in the upper pole collecting system of the right kidney. Musculoskeletal: There are no aggressive appearing lytic or blastic lesions noted in the visualized portions of the skeleton. IMPRESSION: 1. No findings to suggest metastatic disease in the thorax. 2. Interval development of some soft tissue stranding around the pancreas. Correlation with lipase levels and clinical signs/symptoms of pancreatitis is suggested. 3. Morphologic changes in the liver indicative of underlying cirrhosis. Previously noted hepatic mass not confidently identified on today's noncontrast CT examination (please see report for prior abdominal MRI 07/07/2021). Interval placement of percutaneous biliary drainage catheter with small volume of pneumobilia noted on today's examination. 4. Hepatic steatosis. 5. 2 mm nonobstructive calculus in the upper pole collecting system of the right kidney. 6. Aortic atherosclerosis, in addition to 2 vessel coronary artery disease. Please note that although the presence of coronary artery calcium documents the presence of  coronary artery disease, the severity of this disease and any potential stenosis cannot be assessed on this non-gated CT examination. Assessment for potential risk factor modification, dietary therapy or pharmacologic therapy may be warranted, if clinically indicated. 7. In addition, there is ectasia of the ascending thoracic aorta (4.4 cm in diameter). Recommend annual imaging followup by CTA or MRA. This recommendation follows 2010 ACCF/AHA/AATS/ACR/ASA/SCA/SCAI/SIR/STS/SVM Guidelines for the Diagnosis and Management of Patients with Thoracic Aortic Disease. Circulation. 2010; [id number]. Aortic aneurysm NOS (ICD10-I71.9). Aortic Atherosclerosis (ICD10-I70.0). Electronically Signed   By: Vinnie Langton M.D.   On: 07/13/2021 09:08   US Abdomen Limited RUQ (LIVER/GB)  Result Date: 07/11/2021 CLINICAL DATA:  70 year old male with history of cirrhosis and recently diagnosed infiltrative hepatic mass, postprocedure day #1 after left-sided percutaneous internal external biliary drain, now with diffuse severe abdominal pain. EXAM: ULTRASOUND ABDOMEN LIMITED RIGHT UPPER QUADRANT COMPARISON:  07/10/2021, 07/07/2021 FINDINGS: Gallbladder: Scattered  echogenic gallstones, the largest measuring up to 8 mm. No gallbladder distension, significant wall thickening, or pericholecystic fluid. No sonographic Percell Miller sign reported. Common bile duct: Not visualized. Liver: Mild diffuse intrahepatic biliary ductal dilation, equal in the right and left lobes. The known hepatic mass is not well appreciated sonographically. Heterogeneous hepatic parenchyma with nodular contour. Portal vein is patent on color Doppler imaging with normal direction of blood flow towards the liver. Other: No perihepatic ascites. IMPRESSION: 1. No acute right upper quadrant abnormality. 2. Similar to slightly decreased mild diffuse intrahepatic biliary ductal dilation from MRI comparison after left-sided biliary drain placement. 3. The known  infiltrative hepatic mass in segment 8 is not well demonstrated sonographically. 4. Cholelithiasis. Ruthann Cancer, MD [facility] Electronically Signed   By: Ruthann Cancer M.D.   On: 07/11/2021 16:33    Scheduled Meds:  atorvastatin  40 mg Oral QHS   enoxaparin (LOVENOX) injection  30 mg Subcutaneous Daily   insulin aspart  0-15 Units Subcutaneous TID WC   insulin aspart  0-5 Units Subcutaneous QHS   insulin aspart  2 Units Subcutaneous TID WC   sodium chloride flush  5 mL Intracatheter Q8H   Continuous Infusions:    LOS: 6 days    Time spent: 25 mins  Vernell Leep, MD, [place], [facility]. [facility]  To contact the attending provider between 7A-7P or the covering provider during after hours 7P-7A, please log into the web site [email or web address] and access using universal [place] password for that web site. If you do not have the password, please call the hospital operator.

## 2021-07-13 NOTE — TOC Initial Note (Signed)
Transition of Care [facility]) - Initial/Assessment Note    Patient Details  Name: Phillip Saunders MRN: [id number] Date of Birth: Mar 22, 1951  [facility]) CM/SW Contact:    Verdell Carmine, RN Phone Number: 07/13/2021, 8:36 AM  Clinical Narrative:                  70 yo male, admitted with nausea weight loss, large biliary tumor positive for adenocarcinoma via biopsy, has a biliary drain. Wants to discharge today to attend grandsons birthday party. Willl need FU with oncology and PCP, surgical consult is pending. NO DME indicated. Will need teaching from nursing on drain. It is decreasing in drainage,  Expected Discharge Plan: Home/Self Care Barriers to Discharge: Continued Medical Work up   Patient Goals and CMS Choice        Expected Discharge Plan and Services Expected Discharge Plan: Home/Self Care       Living arrangements for the past 2 months: Single Family Home                                      Prior Living Arrangements/Services Living arrangements for the past 2 months: Single Family Home Lives with:: Spouse Patient language and need for interpreter reviewed:: Yes        Need for Family Participation in Patient Care: Yes (Comment) Care giver support system in place?: Yes (comment)   Criminal Activity/Legal Involvement Pertinent to Current Situation/Hospitalization: No - Comment as needed  Activities of Daily Living Home Assistive Devices/Equipment: Eyeglasses ADL Screening (condition at time of admission) Patient's cognitive ability adequate to safely complete daily activities?: Yes Is the patient deaf or have difficulty hearing?: No Does the patient have difficulty seeing, even when wearing glasses/contacts?: No Does the patient have difficulty concentrating, remembering, or making decisions?: No Patient able to express need for assistance with ADLs?: Yes Does the patient have difficulty dressing or bathing?: No Independently performs  ADLs?: Yes (appropriate for developmental age) Does the patient have difficulty walking or climbing stairs?: No Weakness of Legs: None Weakness of Arms/Hands: None  Permission Sought/Granted                  Emotional Assessment       Orientation: : Oriented to Self, Oriented to Place, Oriented to  Time, Oriented to Situation Alcohol / Substance Use: Not Applicable Psych Involvement: No (comment)  Admission diagnosis:  Splenomegaly [R16.1] Hepatobiliary tract disease [K83.9] Nausea [R11.0] Jaundice [R17] Transaminitis [R74.01] Other cirrhosis of liver (HCC) [K74.69] Patient Active Problem List   Diagnosis Date Noted   Pain of upper abdomen    Elevated CA 19-9 level    Elevated carcinoembryonic antigen (CEA)    Liver mass    Jaundice    Hepatobiliary tract disease 07/07/2021   Cirrhosis of liver ([place]) 07/06/2021   Acquired dilation of bile duct 07/06/2021   DM2 (diabetes mellitus, type 2) ([place]) 07/06/2021   Stress and adjustment reaction 11/15/2016   Hyperlipidemia, mixed 03/22/2016   Abnormal liver function test 03/19/2016   Fasting hyperglycemia 03/18/2016   Screening for lipid disorders 03/18/2016   Nephrolithiasis 03/05/2016   Screening for colon cancer 03/05/2016   Screening for prostate cancer 03/05/2016   Wellness examination 03/05/2016   Benign hypertension 03/04/2016   Lower urinary tract symptoms (LUTS) 03/04/2016   PCP:  Algis Greenhouse, MD Pharmacy:   [id number] [place] - [address] AT [address] [place] [address] Phone: [phone] Fax: [phone]     Social Determinants of Health (SDOH) Interventions    Readmission Risk Interventions No flowsheet data found.

## 2021-07-13 NOTE — Care Management Important Message (Signed)
Important Message  Patient Details  Name: Phillip Saunders MRN: [id number] Date of Birth: 10-May-1951   Medicare Important Message Given:  Yes     Orbie Pyo 07/13/2021, 3:35 PM

## 2021-07-13 NOTE — Progress Notes (Signed)
Admit: 07/06/2021 LOS: 6  84M AKI in setting of hypovolemia + ARB; in setting of new Dx of cirrhosis, cholangiocarcinoma s/p percutaneous biliary drain with high output  Subjective:  Feels well SCr improved to 1.8 Rec NS @ 121m/h No problems with PO  08/25 0701 - 08/26 0700 In: 1801.3 [P.O.:240; I.V.:1551.3] Out: 3625 [Urine:275; Drains:3350]  Filed Weights   07/06/21 1857  Weight: 88.9 kg    Scheduled Meds:  atorvastatin  40 mg Oral QHS   enoxaparin (LOVENOX) injection  30 mg Subcutaneous Daily   insulin aspart  0-15 Units Subcutaneous TID WC   insulin aspart  0-5 Units Subcutaneous QHS   insulin aspart  2 Units Subcutaneous TID WC   sodium chloride flush  5 mL Intracatheter Q8H   Continuous Infusions: PRN Meds:.acetaminophen **OR** acetaminophen, HYDROmorphone (DILAUDID) injection, LORazepam, ondansetron **OR** ondansetron (ZOFRAN) IV, traMADol  Current Labs: reviewed    Physical Exam:  Blood pressure 128/90, pulse 76, temperature 99.1 F (37.3 C), temperature source Oral, resp. rate 20, height '5\' 3"'$  (1.6 m), weight 88.9 kg, SpO2 (!) 84 %. NAD, well appearing RRR CTAB Bandage on ant abdomen, soft NoLEE AAO x3, no asterixus  A AKI 2/2 hypovolemia + ARB: improving Recent diagnosis cholangioCa + obstruction and s/p percutaneous biliary obstruction; oncology f/u at DC Cirrhosis DM2 off metformin HTN, BPs stable no meds  P Stop IVFs Encourage PO intake to exceed his outputs If improved GFR off IVFs tomorrow, ok for DC Daily weights, Daily Renal Panel, Strict I/Os, Avoid nephrotoxins (NSAIDs, judicious IV Contrast)    RPearson GrippeMD 07/13/2021, 12:11 PM  Recent Labs  Lab 07/11/21 1414 07/12/21 0138 07/13/21 0144  NA 134* 134* 132*  K 4.0 5.0 4.4  CL 103 99 105  CO2 21* 23 16*  GLUCOSE 131* 120* 129*  BUN 30* 37* 50*  CREATININE 1.26* 2.82* 1.79*  CALCIUM 9.8 10.1 9.7   Recent Labs  Lab 07/06/21 1925 07/07/21 0205 07/11/21 1414 07/12/21 0138  07/13/21 0144  WBC 5.2   < > 10.4 11.3* 13.5*  NEUTROABS 3.8  --   --   --   --   HGB 14.7   < > 17.9* 18.1* 18.0*  HCT 43.1   < > 51.7 52.8* 51.9  MCV 96.0   < > 93.7 95.5 94.4  PLT 90*   < > 147* 149* 169   < > = values in this interval not displayed.

## 2021-07-14 LAB — GLUCOSE, CAPILLARY
Glucose-Capillary: 161 mg/dL — ABNORMAL HIGH (ref 70–99)
Glucose-Capillary: 185 mg/dL — ABNORMAL HIGH (ref 70–99)

## 2021-07-14 LAB — CBC
HCT: 51.2 % (ref 39.0–52.0)
Hemoglobin: 17.4 g/dL — ABNORMAL HIGH (ref 13.0–17.0)
MCH: 32.2 pg (ref 26.0–34.0)
MCHC: 34 g/dL (ref 30.0–36.0)
MCV: 94.8 fL (ref 80.0–100.0)
Platelets: 160 10*3/uL (ref 150–400)
RBC: 5.4 MIL/uL (ref 4.22–5.81)
RDW: 14.3 % (ref 11.5–15.5)
WBC: 9.3 10*3/uL (ref 4.0–10.5)
nRBC: 0 % (ref 0.0–0.2)

## 2021-07-14 LAB — COMPREHENSIVE METABOLIC PANEL
ALT: 81 U/L — ABNORMAL HIGH (ref 0–44)
AST: 74 U/L — ABNORMAL HIGH (ref 15–41)
Albumin: 3 g/dL — ABNORMAL LOW (ref 3.5–5.0)
Alkaline Phosphatase: 234 U/L — ABNORMAL HIGH (ref 38–126)
Anion gap: 11 (ref 5–15)
BUN: 49 mg/dL — ABNORMAL HIGH (ref 8–23)
CO2: 19 mmol/L — ABNORMAL LOW (ref 22–32)
Calcium: 9.6 mg/dL (ref 8.9–10.3)
Chloride: 102 mmol/L (ref 98–111)
Creatinine, Ser: 1.2 mg/dL (ref 0.61–1.24)
GFR, Estimated: >60 mL/min (ref >60)
Glucose, Bld: 137 mg/dL — ABNORMAL HIGH (ref 70–99)
Potassium: 4.3 mmol/L (ref 3.5–5.1)
Sodium: 132 mmol/L — ABNORMAL LOW (ref 135–145)
Total Bilirubin: 4.6 mg/dL — ABNORMAL HIGH (ref 0.3–1.2)
Total Protein: 8 g/dL (ref 6.5–8.1)

## 2021-07-14 LAB — LIPASE, BLOOD: Lipase: 424 U/L — ABNORMAL HIGH (ref 11–51)

## 2021-07-14 LAB — CANCER ANTIGEN 19-9: CA 19-9: 986 U/mL — ABNORMAL HIGH (ref 0–35)

## 2021-07-14 MED ORDER — TRAMADOL HCL 50 MG PO TABS: 50.0000 mg | ORAL_TABLET | Freq: Four times a day (QID) | ORAL | 0 refills | Status: AC | PRN

## 2021-07-14 MED ORDER — ACETAMINOPHEN 500 MG PO TABS: 500.0000 mg | ORAL_TABLET | Freq: Four times a day (QID) | ORAL | Status: AC | PRN

## 2021-07-14 MED ORDER — SODIUM BICARBONATE 650 MG PO TABS: 650.0000 mg | ORAL_TABLET | Freq: Two times a day (BID) | ORAL | 0 refills | Status: AC

## 2021-07-14 MED ORDER — ENOXAPARIN SODIUM 40 MG/0.4ML IJ SOSY: 40.0000 mg | PREFILLED_SYRINGE | Freq: Every day | INTRAMUSCULAR | Status: DC

## 2021-07-14 MED ORDER — SODIUM BICARBONATE 650 MG PO TABS
650.0000 mg | ORAL_TABLET | Freq: Two times a day (BID) | ORAL | Status: DC
  Administered 2021-07-14: 650 mg via ORAL
  Filled 2021-07-14: qty 1

## 2021-07-14 NOTE — Progress Notes (Signed)
Admit: 07/06/2021 LOS: 7  48M AKI in setting of hypovolemia + ARB; in setting of new Dx of cirrhosis, cholangiocarcinoma s/p percutaneous biliary drain with high output  Subjective:  Feels well SCr improved to 1.2 Off IVFs On RA, VSS  08/26 0701 - 08/27 0700 In: 690 [P.O.:680] Out: 2375 [Urine:1025; Drains:1350]  Filed Weights   07/06/21 1857  Weight: 88.9 kg    Scheduled Meds:  atorvastatin  40 mg Oral QHS   enoxaparin (LOVENOX) injection  30 mg Subcutaneous Daily   insulin aspart  0-15 Units Subcutaneous TID WC   insulin aspart  0-5 Units Subcutaneous QHS   insulin aspart  2 Units Subcutaneous TID WC   sodium chloride flush  5 mL Intracatheter Q8H   Continuous Infusions: PRN Meds:.acetaminophen **OR** acetaminophen, HYDROmorphone (DILAUDID) injection, LORazepam, ondansetron **OR** ondansetron (ZOFRAN) IV, traMADol  Current Labs: reviewed    Physical Exam:  Blood pressure (!) 125/97, pulse 92, temperature 97.9 F (36.6 C), temperature source Oral, resp. rate 16, height '5\' 3"'$  (1.6 m), weight 88.9 kg, SpO2 93 %. NAD, well appearing RRR CTAB Bandage on ant abdomen, soft NoLEE AAO x3  A AKI 2/2 hypovolemia + ARB: improving Recent diagnosis cholangioCa + obstruction and s/p percutaneous biliary obstruction; oncology f/u at DC Cirrhosis DM2 off metformin HTN, BPs stable no meds  P No further inpatient renal needs; ok to DC Does not need nephrology f/u unless req by oncology Stay off metformin and ARB at DC; amlodipine  Will sign off, call for questions or concerns   Pearson Grippe MD 07/14/2021, 10:13 AM  Recent Labs  Lab 07/12/21 0138 07/13/21 0144 07/14/21 0255  NA 134* 132* 132*  K 5.0 4.4 4.3  CL 99 105 102  CO2 23 16* 19*  GLUCOSE 120* 129* 137*  BUN 37* 50* 49*  CREATININE 2.82* 1.79* 1.20  CALCIUM 10.1 9.7 9.6    Recent Labs  Lab 07/12/21 0138 07/13/21 0144 07/14/21 0255  WBC 11.3* 13.5* 9.3  HGB 18.1* 18.0* 17.4*  HCT 52.8* 51.9 51.2   MCV 95.5 94.4 94.8  PLT 149* 169 160

## 2021-07-14 NOTE — Discharge Summary (Addendum)
1Physician Discharge Summary  Hallett Soter [id number] DOB: 1951-07-05 DOA: 07/06/2021  PCP: Algis Greenhouse, MD  Admit date: 07/06/2021 Discharge date: 07/14/2021  Admitted From: Home  Disposition:  Home   Recommendations for Outpatient Follow-up and new medication changes:  Follow up with Dr. Garlon Hatchet in 7 to 10 days.  Continue to flush drain daily and document output.  Follow with Oncology Dr Burr Medico as scheduled.  Holding losartan to prevent hypotension   Home Health: no   Equipment/Devices: no    Discharge Condition: stable CODE STATUS: full  Diet recommendation: diabetic prudent, soft and advance as tolerated.   Brief/Interim Summary: Mr. 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 Now sp biliary drain placement. Complicated with acute pancreatitis and AKI.   70 year old male past medical history for hypertension and type 2 diabetes mellitus who presented with jaundice.  Reported 4 days of nausea and vomiting.  On the day of hospitalization he noticed jaundice that prompted him to come to the hospital.  Denied any abdominal pain.  On his initial physical examination his blood pressure was 146/83, heart rate 102, respiratory rate 22, oxygen saturation 92%, he had positive icterus, his lungs were clear to auscultation, heart S1-S2, present, rhythmic, his abdomen was soft and nontender, no lower extremity edema.  Sodium 131, potassium 3.5, chloride 102, bicarb 22, glucose 173, BUN 16, creatinine 0.85, lipase 51, AST 126, ALT 149, total bilirubin 7.0, white count 5.2, hemoglobin 14.7, hematocrit 43.1, platelets 90. SARS COVID-19 negative.  Urinalysis specific gravity 1.039, negative protein, negative nitrates.  6-10 white cells.  CT of the abdomen and pelvis showed liver cirrhosis, moderate intrahepatic biliary dilatation.  Heterogeneous hyperenhancement of the right hepatic lobe.  Mild porta hepatis  adenopathy.  Splenomegaly.  EKG 91 bpm, normal axis, normal intervals, sinus rhythm, no significant ST segment or T wave changes.  Patient received supportive medical therapy.  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  Upper abdominal adenopathy.  Portal venous hypertension and splenomegaly.  Cholelithiasis.  08/23 patient underwent cholangiogram and placement of percutaneous biliary drain.  Interventional radiology.  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  Patient developed high-volume biliary drain output, postprocedure acute pancreatitis. Worsening kidney function that required intravenous fluids.  Slowly his symptoms resolved, kidney function and abdominal pain.  Patient will follow-up as an outpatient with oncology for chemotherapy.  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 Postprocedure pancreatitis. Patient has been tolerating well soft diet, no significant nausea or vomiting. She will be discharged with as needed tramadol for pain control.  His tumor markers are elevated, AFP, CEA a and CEA. He will follow-up as an outpatient with Dr. Burr Medico from oncology for chemotherapy.  2. Acute kidney injury, hyponatremia, no anion gap metabolic acidosis his renal function has been improving.  At the time of discharge serum creatinine 1.20, sodium 132, potassium 4.3 and bicarb 19. Patient likely has bicarb loss through his biliary drain.  Will add sodium bicarb to take at home and follow-up kidney function as an outpatient.  3.  Hypertension.  His blood pressure has been well controlled. Plan to continue holding losartan to prevent hypotension.   4.  Type 2 diabetes mellitus/dyslipidemia patient received insulin sliding scale for glucose  coverage monitoring.   At discharge continue metformin.  Continue statin therapy.  5.  Chronic  liver cirrhosis/ thrombocytopenia.  No signs of acute decompensation, follow-up as an outpatient. Discharge platelet count 160.   Discharge Diagnoses:  Principal Problem:   Acquired dilation of bile duct Active Problems:   Benign hypertension   Cirrhosis of liver (HCC)   DM2 (diabetes mellitus, type 2) (HCC)   Hepatobiliary tract disease   Liver mass   Jaundice   Elevated CA 19-9 level   Elevated carcinoembryonic antigen (CEA)   Pain of upper abdomen    Discharge Instructions   Allergies as of 07/14/2021   No Known Allergies      Medication List     STOP taking these medications    irbesartan 150 MG tablet Commonly known as: AVAPRO       TAKE these medications    acetaminophen 500 MG tablet Commonly known as: TYLENOL Take 1 tablet (500 mg total) by mouth every 6 (six) hours as needed for mild pain or moderate pain.   atorvastatin 40 MG tablet Commonly known as: LIPITOR Take 40 mg by mouth at bedtime.   metFORMIN 1000 MG tablet Commonly known as: GLUCOPHAGE Take 1,000 mg by mouth 2 (two) times daily with a meal.   Rybelsus 7 MG Tabs Generic drug: Semaglutide Take 7 mg by mouth every morning.   sodium bicarbonate 650 MG tablet Take 1 tablet (650 mg total) by mouth 2 (two) times daily.   traMADol 50 MG tablet Commonly known as: ULTRAM Take 1 tablet (50 mg total) by mouth every 6 (six) hours as needed for moderate pain.        No Known Allergies  Consultations: IR  GI  Oncology    Procedures/Studies: CT CHEST WO CONTRAST  Result Date: 07/13/2021 CLINICAL DATA:  70 year old male with history of liver mass. Staging examination. EXAM: CT CHEST WITHOUT CONTRAST TECHNIQUE: Multidetector CT imaging of the chest was performed following the standard protocol without IV contrast. COMPARISON:  Chest CT 01/25/2011. FINDINGS: Cardiovascular: Heart size is  normal. There is no significant pericardial fluid, thickening or pericardial calcification. There is aortic atherosclerosis, as well as atherosclerosis of the great vessels of the mediastinum and the coronary arteries, including calcified atherosclerotic plaque in the left anterior descending and right coronary arteries. Ectasia of ascending thoracic aorta (4.4 cm in diameter). Mediastinum/Nodes: No pathologically enlarged mediastinal or hilar lymph nodes. Please note that accurate exclusion of hilar adenopathy is limited on noncontrast CT scans. Lungs/Pleura: No suspicious appearing pulmonary nodules or masses are noted. No acute consolidative airspace disease. No pleural effusions. Linear areas of subsegmental atelectasis or scarring are noted in the lower lobes of the lungs bilaterally. Upper Abdomen: Percutaneous biliary drain incompletely imaged. Liver has a shrunken appearance and nodular contour, indicative of advanced cirrhosis. Diffuse low attenuation throughout the visualized hepatic parenchyma, indicative of a background of hepatic steatosis. Small amount of pneumobilia in the left lobe of the liver. Previously suspected hepatic mass not confidently identified on today's noncontrast CT examination. Soft tissue stranding in the retroperitoneum adjacent to the pancreas. 2 mm nonobstructive calculus in the upper pole collecting system of the right kidney. Musculoskeletal: There are no aggressive appearing lytic or blastic lesions noted in the visualized portions of the skeleton. IMPRESSION: 1. No findings to suggest metastatic disease in the thorax. 2. Interval development of some soft tissue stranding around the pancreas. Correlation with lipase levels and clinical signs/symptoms of pancreatitis is suggested. 3. Morphologic changes in the liver indicative of underlying cirrhosis. Previously noted hepatic mass not confidently identified on today's noncontrast CT  examination (please see report for prior  abdominal MRI 07/07/2021). Interval placement of percutaneous biliary drainage catheter with small volume of pneumobilia noted on today's examination. 4. Hepatic steatosis. 5. 2 mm nonobstructive calculus in the upper pole collecting system of the right kidney. 6. Aortic atherosclerosis, in addition to 2 vessel coronary artery disease. Please note that although the presence of coronary artery calcium documents the presence of coronary artery disease, the severity of this disease and any potential stenosis cannot be assessed on this non-gated CT examination. Assessment for potential risk factor modification, dietary therapy or pharmacologic therapy may be warranted, if clinically indicated. 7. In addition, there is ectasia of the ascending thoracic aorta (4.4 cm in diameter). Recommend annual imaging followup by CTA or MRA. This recommendation follows 2010 ACCF/AHA/AATS/ACR/ASA/SCA/SCAI/SIR/STS/SVM Guidelines for the Diagnosis and Management of Patients with Thoracic Aortic Disease. Circulation. 2010; [id number]. Aortic aneurysm NOS (ICD10-I71.9). Aortic Atherosclerosis (ICD10-I70.0). Electronically Signed   By: Vinnie Langton M.D.   On: 07/13/2021 09:08   CT ABDOMEN PELVIS W CONTRAST  Result Date: 07/06/2021 CLINICAL DATA:  Jaundice EXAM: CT ABDOMEN AND PELVIS WITH CONTRAST TECHNIQUE: Multidetector CT imaging of the abdomen and pelvis was performed using the standard protocol following bolus administration of intravenous contrast. CONTRAST:  158m OMNIPAQUE IOHEXOL 350 MG/ML SOLN COMPARISON:  MRI 07/31/2017, CT 02/06/2012 FINDINGS: Lower chest: Lung bases demonstrate no acute consolidation or effusion. Borderline aneurysmal dilatation of aortic root, similar to 2012. Mild cardiomegaly. Hepatobiliary: Heterogeneous hyperenhancement within the right hepatic lobe. Moderate intra hepatic biliary dilatation. Poorly visible extrahepatic common bile duct, does not appear dilated at head of pancreas. Lobulated  liver morphology consistent with cirrhosis. Multiple calcified gallstones. Pancreas: Unremarkable. No pancreatic ductal dilatation or surrounding inflammatory changes. Spleen: Enlarged measuring 15.5 cm Adrenals/Urinary Tract: Adrenal glands are normal. Kidneys show no hydronephrosis. Subcentimeter hypodense lesions too small to further characterize. The bladder is unremarkable Stomach/Bowel: The stomach is nonenlarged. No dilated small bowel. No acute bowel wall thickening. Vascular/Lymphatic: Nonaneurysmal aorta. Multiple enlarged porta hepatis lymph nodes measuring up to 16 mm. Distal esophageal node measuring 10 mm. Numerous small tortuous vessels in the left upper quadrant. Reproductive: Prostate is unremarkable. Other: No free air. No significant ascites. Fat containing right inguinal hernia. Musculoskeletal: No acute osseous abnormality IMPRESSION: 1. Liver cirrhosis. Interim development of moderate intra hepatic biliary dilatation. Poorly visible extrahepatic common bile duct which does not appear dilated at head of pancreas, findings raise concern for hilar obstructing process though no gross mass seen on CT. There is heterogeneous hyperenhancement of right hepatic lobe indeterminate for mass or altered perfusion. Further evaluation with MRI/MRCP is recommended. 2. Mild porta hepatis adenopathy indeterminate for metastatic disease. 3. Splenomegaly and findings suggestive of portal hypertension Electronically Signed   By: KDonavan FoilM.D.   On: 07/06/2021 21:35   IR [place] Bx Asp/Drain  Result Date: 07/10/2021 INDICATION: 70year old male with evidence of cirrhosis, significant intrahepatic biliary ductal dilatation with biliary occlusion at the intrahepatic hilum, obstructed jaundice and a possible infiltrative mass within hepatic segment 8. Patient presents today for placement a percutaneous biliary drainage catheter, attempted internalization past the central obstruction, brush biopsy and also  potentially ultrasound-guided core biopsy of the suspected hepatic parenchymal mass. EXAM: 1. Percutaneous transhepatic cholangiogram 2. Transhepatic brush biopsy of biliary lesion 3. Successful crossing of biliary stenosis and placement of internal/external percutaneous biliary drainage catheter 4. Ultrasound-guided core biopsy of ill-defined masslike region in hepatic segment 8 MEDICATIONS: 2 g cefoxitin; The antibiotic was administered within an  appropriate time frame prior to the initiation of the procedure. ANESTHESIA/SEDATION: Moderate (conscious) sedation was employed during this procedure. A total of Versed 4 mg and Fentanyl 200 mcg was administered intravenously. Moderate Sedation Time: 49 minutes. The patient's level of consciousness and vital signs were monitored continuously by radiology nursing throughout the procedure under my direct supervision. FLUOROSCOPY TIME:  Fluoroscopy Time: 12 minutes 24 seconds (143 mGy). COMPLICATIONS: None immediate. PROCEDURE: Informed written consent was obtained from the patient after a thorough discussion of the procedural risks, benefits and alternatives. All questions were addressed. Maximal Sterile Barrier Technique was utilized including caps, mask, sterile gowns, sterile gloves, sterile drape, hand hygiene and skin antiseptic. A timeout was performed prior to the initiation of the procedure. Ultrasound was used to interrogate the left hemi liver. Biliary ductal dilatation is identified. A suitable skin entry site was selected and marked. Local anesthesia was attained by infiltration with 1% lidocaine. A small dermatotomy was made. Using a 21 gauge Chiba needle, the needle was carefully advanced through the left lobe of the liver and used to puncture the dilated bile duct. A transhepatic cholangiogram was then performed. This demonstrates successful access of a tertiary biliary radicle. There is marked intrahepatic biliary ductal dilatation. A large filling defect  is present within the hepatic hilum extending into the common hepatic duct. Ultimately, a 0.018 microwire was advanced centrally within the biliary tree. The access needle was removed. The Accustick sheath was advanced over the wire and into the left main hepatic duct. Additional cholangiography was performed. There is no cross-filling into the right intrahepatic ducts. The cystic duct is patent. The common bile duct is patent. A road runner wire was used to successfully traverse the obstructing mass at the hepatic confluence. The Accustick sheath was then removed. The tract was serially dilated to 10 [place]. A 6 French vascular sheath was advanced over the wire and positioned adjacent to the obstructing mass in the common hepatic duct. Brush biopsies were then obtained. The 6 French sheath was then removed. A 10 [place] biliary drainage catheter was then advanced over the wire, past the occlusion and into the duodenum. The distal loop was reconstituted in the duodenum. Contrast injection confirms adequate placement of the catheter with persistent filling of the dilated intrahepatic left ducts and passage of contrast material into the duodenum. The catheter was secured to the skin with 0 Prolene suture. Attention was next turned to the right upper quadrant. The right upper quadrant was interrogated with ultrasound. A region of heterogeneity in hepatic segment 8 was identified. This appears to correspond with the suspected mass lesion on prior CT and MRI imaging. Local anesthesia was again attained by infiltration with 1% lidocaine. A small dermatotomy was made. Under real-time ultrasound guidance, a 17 gauge introducer needle was advanced into the liver. Multiple 18 gauge core biopsies were then coaxially obtained using the bio Pince automated biopsy device. Biopsy specimens were placed in formalin and delivered to pathology for further analysis. As the introducer needle was removed, the biopsy tract was embolized  with a Gel-Foam slurry. IMPRESSION: 1. Successful percutaneous transhepatic cholangiogram demonstrating an obstructing mass at the intrahepatic confluence extending into the common hepatic duct. 2. Brush biopsy of intra biliary mass. 3. Successful placement of a 10 French internal/external biliary drainage catheter. 4. Ultrasound-guided core biopsy of region of ill-defined heterogeneity in segment 8 of the liver. PLAN: 1. Maintain internal/external biliary drainage catheter to bag until bilirubin has decreased adequately. Capping may then be attempted. 2.  Due to underlying cirrhosis and hepatomegaly, the existing standard internal/external biliary drainage catheter just covers the occluded segment of the biliary tree. In the future, biliary drainage catheters may need to be modified with additional sideholes to provide more broad coverage. Electronically Signed   By: Jacqulynn Cadet M.D.   On: 07/10/2021 15:55   MR ABDOMEN MRCP W WO CONTAST  Result Date: 07/07/2021 CLINICAL DATA:  Recent jaundice. CT demonstrating cirrhosis and possible primary malignancy. EXAM: MRI ABDOMEN WITHOUT AND WITH CONTRAST (INCLUDING MRCP) TECHNIQUE: Multiplanar multisequence MR imaging of the abdomen was performed both before and after the administration of intravenous contrast. Heavily T2-weighted images of the biliary and pancreatic ducts were obtained, and three-dimensional MRCP images were rendered by post processing. CONTRAST:  66m GADAVIST GADOBUTROL 1 MMOL/ML IV SOLN COMPARISON:  CT of 1 day prior FINDINGS: Moderately motion degraded exam. The extent of motion makes LI-RADS characterisation impossible. Lower chest: Mild cardiomegaly, without pericardial or pleural effusion. Hepatobiliary: Marked cirrhosis, with caudate lobe enlargement and irregular hepatic capsule. Centered in segment 8 is infiltrative relatively diffuse T2 hyperintensity on 09/05, corresponding to restricted diffusion on 43/6 and heterogeneous arterial  phase hyperenhancement including on 40/1101. Example at on the order of 10.1 x 8.7 cm. The T2 signal and restricted diffusion extend towards the right portal vein, which is not opacified on post-contrast images, and likely involved with tumor. Arterial phase images demonstrate multiple other areas of subcentimeter hyperenhancement, not confidently identified on later post-contrast imaging. Indeterminate. Moderate intrahepatic biliary duct dilatation within the left hepatic lobe and posterior right hepatic lobe, followed to the presumed central right hepatic lobe tumor. Example obstruction on 50 through 53 of 1,103. Small gallstones without specific evidence of acute cholecystitis. No common duct dilatation. Pancreas:  Normal, without mass or ductal dilatation. Spleen:  Splenomegaly at 15.5 cm craniocaudal. Adrenals/Urinary Tract: Normal adrenal glands. Bilateral small renal cysts. No hydronephrosis. Stomach/Bowel: Normal stomach and abdominal bowel loops. Vascular/Lymphatic: Aortic atherosclerosis. The main portal vein and splenic veins are patent. Extensive portosystemic collaterals. 1.2 cm periesophageal node at the level of the diaphragmatic hiatus on 11/05. Gastrohepatic ligament node measures 1.5 cm on 77/1103. Other:  No ascites. Musculoskeletal: No acute osseous abnormality. Probable hemangiomas within the midthoracic and mid lumbar spine. IMPRESSION: 1. Moderately motion degraded exam, with limitations above. 2. 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 As above, the extent of motion degradation makes LI-Rads categorization impossible. 3. Upper abdominal adenopathy which could be metastatic or reactive. 4. Portal venous hypertension and splenomegaly. 5. Cholelithiasis Electronically Signed   By: KAbigail MiyamotoM.D.   On: 07/07/2021 06:25   IR  INT EXT BILIARY DRAIN WITH CHOLANGIOGRAM  Result Date: 07/10/2021 INDICATION: 70year old male with evidence of cirrhosis, significant intrahepatic biliary ductal dilatation with biliary occlusion at the intrahepatic hilum, obstructed jaundice and a possible infiltrative mass within hepatic segment 8. Patient presents today for placement a percutaneous biliary drainage catheter, attempted internalization past the central obstruction, brush biopsy and also potentially ultrasound-guided core biopsy of the suspected hepatic parenchymal mass. EXAM: 1. Percutaneous transhepatic cholangiogram 2. Transhepatic brush biopsy of biliary lesion 3. Successful crossing of biliary stenosis and placement of internal/external percutaneous biliary drainage catheter 4. Ultrasound-guided core biopsy of ill-defined masslike region in hepatic segment 8 MEDICATIONS: 2 g cefoxitin; The antibiotic was administered within an appropriate time frame prior to the initiation of the procedure. ANESTHESIA/SEDATION: Moderate (conscious)  sedation was employed during this procedure. A total of Versed 4 mg and Fentanyl 200 mcg was administered intravenously. Moderate Sedation Time: 49 minutes. The patient's level of consciousness and vital signs were monitored continuously by radiology nursing throughout the procedure under my direct supervision. FLUOROSCOPY TIME:  Fluoroscopy Time: 12 minutes 24 seconds (143 mGy). COMPLICATIONS: None immediate. PROCEDURE: Informed written consent was obtained from the patient after a thorough discussion of the procedural risks, benefits and alternatives. All questions were addressed. Maximal Sterile Barrier Technique was utilized including caps, mask, sterile gowns, sterile gloves, sterile drape, hand hygiene and skin antiseptic. A timeout was performed prior to the initiation of the procedure. Ultrasound was used to interrogate the left hemi liver. Biliary ductal dilatation is identified. A suitable skin entry  site was selected and marked. Local anesthesia was attained by infiltration with 1% lidocaine. A small dermatotomy was made. Using a 21 gauge Chiba needle, the needle was carefully advanced through the left lobe of the liver and used to puncture the dilated bile duct. A transhepatic cholangiogram was then performed. This demonstrates successful access of a tertiary biliary radicle. There is marked intrahepatic biliary ductal dilatation. A large filling defect is present within the hepatic hilum extending into the common hepatic duct. Ultimately, a 0.018 microwire was advanced centrally within the biliary tree. The access needle was removed. The Accustick sheath was advanced over the wire and into the left main hepatic duct. Additional cholangiography was performed. There is no cross-filling into the right intrahepatic ducts. The cystic duct is patent. The common bile duct is patent. A road runner wire was used to successfully traverse the obstructing mass at the hepatic confluence. The Accustick sheath was then removed. The tract was serially dilated to 10 [place]. A 6 French vascular sheath was advanced over the wire and positioned adjacent to the obstructing mass in the common hepatic duct. Brush biopsies were then obtained. The 6 French sheath was then removed. A 10 [place] biliary drainage catheter was then advanced over the wire, past the occlusion and into the duodenum. The distal loop was reconstituted in the duodenum. Contrast injection confirms adequate placement of the catheter with persistent filling of the dilated intrahepatic left ducts and passage of contrast material into the duodenum. The catheter was secured to the skin with 0 Prolene suture. Attention was next turned to the right upper quadrant. The right upper quadrant was interrogated with ultrasound. A region of heterogeneity in hepatic segment 8 was identified. This appears to correspond with the suspected mass lesion on prior CT and MRI imaging.  Local anesthesia was again attained by infiltration with 1% lidocaine. A small dermatotomy was made. Under real-time ultrasound guidance, a 17 gauge introducer needle was advanced into the liver. Multiple 18 gauge core biopsies were then coaxially obtained using the bio Pince automated biopsy device. Biopsy specimens were placed in formalin and delivered to pathology for further analysis. As the introducer needle was removed, the biopsy tract was embolized with a Gel-Foam slurry. IMPRESSION: 1. Successful percutaneous transhepatic cholangiogram demonstrating an obstructing mass at the intrahepatic confluence extending into the common hepatic duct. 2. Brush biopsy of intra biliary mass. 3. Successful placement of a 10 French internal/external biliary drainage catheter. 4. Ultrasound-guided core biopsy of region of ill-defined heterogeneity in segment 8 of the liver. PLAN: 1. Maintain internal/external biliary drainage catheter to bag until bilirubin has decreased adequately. Capping may then be attempted. 2. Due to underlying cirrhosis and hepatomegaly, the existing standard internal/external biliary drainage catheter  just covers the occluded segment of the biliary tree. In the future, biliary drainage catheters may need to be modified with additional sideholes to provide more broad coverage. Electronically Signed   By: Jacqulynn Cadet M.D.   On: 07/10/2021 15:55   IR ENDOLUMINAL BX OF BILIARY TREE  Result Date: 07/10/2021 INDICATION: 70 year old male with evidence of cirrhosis, significant intrahepatic biliary ductal dilatation with biliary occlusion at the intrahepatic hilum, obstructed jaundice and a possible infiltrative mass within hepatic segment 8. Patient presents today for placement a percutaneous biliary drainage catheter, attempted internalization past the central obstruction, brush biopsy and also potentially ultrasound-guided core biopsy of the suspected hepatic parenchymal mass. EXAM: 1.  Percutaneous transhepatic cholangiogram 2. Transhepatic brush biopsy of biliary lesion 3. Successful crossing of biliary stenosis and placement of internal/external percutaneous biliary drainage catheter 4. Ultrasound-guided core biopsy of ill-defined masslike region in hepatic segment 8 MEDICATIONS: 2 g cefoxitin; The antibiotic was administered within an appropriate time frame prior to the initiation of the procedure. ANESTHESIA/SEDATION: Moderate (conscious) sedation was employed during this procedure. A total of Versed 4 mg and Fentanyl 200 mcg was administered intravenously. Moderate Sedation Time: 49 minutes. The patient's level of consciousness and vital signs were monitored continuously by radiology nursing throughout the procedure under my direct supervision. FLUOROSCOPY TIME:  Fluoroscopy Time: 12 minutes 24 seconds (143 mGy). COMPLICATIONS: None immediate. PROCEDURE: Informed written consent was obtained from the patient after a thorough discussion of the procedural risks, benefits and alternatives. All questions were addressed. Maximal Sterile Barrier Technique was utilized including caps, mask, sterile gowns, sterile gloves, sterile drape, hand hygiene and skin antiseptic. A timeout was performed prior to the initiation of the procedure. Ultrasound was used to interrogate the left hemi liver. Biliary ductal dilatation is identified. A suitable skin entry site was selected and marked. Local anesthesia was attained by infiltration with 1% lidocaine. A small dermatotomy was made. Using a 21 gauge Chiba needle, the needle was carefully advanced through the left lobe of the liver and used to puncture the dilated bile duct. A transhepatic cholangiogram was then performed. This demonstrates successful access of a tertiary biliary radicle. There is marked intrahepatic biliary ductal dilatation. A large filling defect is present within the hepatic hilum extending into the common hepatic duct. Ultimately, a 0.018  microwire was advanced centrally within the biliary tree. The access needle was removed. The Accustick sheath was advanced over the wire and into the left main hepatic duct. Additional cholangiography was performed. There is no cross-filling into the right intrahepatic ducts. The cystic duct is patent. The common bile duct is patent. A road runner wire was used to successfully traverse the obstructing mass at the hepatic confluence. The Accustick sheath was then removed. The tract was serially dilated to 10 [place]. A 6 French vascular sheath was advanced over the wire and positioned adjacent to the obstructing mass in the common hepatic duct. Brush biopsies were then obtained. The 6 French sheath was then removed. A 10 [place] biliary drainage catheter was then advanced over the wire, past the occlusion and into the duodenum. The distal loop was reconstituted in the duodenum. Contrast injection confirms adequate placement of the catheter with persistent filling of the dilated intrahepatic left ducts and passage of contrast material into the duodenum. The catheter was secured to the skin with 0 Prolene suture. Attention was next turned to the right upper quadrant. The right upper quadrant was interrogated with ultrasound. A region of heterogeneity in hepatic segment 8 was  identified. This appears to correspond with the suspected mass lesion on prior CT and MRI imaging. Local anesthesia was again attained by infiltration with 1% lidocaine. A small dermatotomy was made. Under real-time ultrasound guidance, a 17 gauge introducer needle was advanced into the liver. Multiple 18 gauge core biopsies were then coaxially obtained using the bio Pince automated biopsy device. Biopsy specimens were placed in formalin and delivered to pathology for further analysis. As the introducer needle was removed, the biopsy tract was embolized with a Gel-Foam slurry. IMPRESSION: 1. Successful percutaneous transhepatic cholangiogram  demonstrating an obstructing mass at the intrahepatic confluence extending into the common hepatic duct. 2. Brush biopsy of intra biliary mass. 3. Successful placement of a 10 French internal/external biliary drainage catheter. 4. Ultrasound-guided core biopsy of region of ill-defined heterogeneity in segment 8 of the liver. PLAN: 1. Maintain internal/external biliary drainage catheter to bag until bilirubin has decreased adequately. Capping may then be attempted. 2. Due to underlying cirrhosis and hepatomegaly, the existing standard internal/external biliary drainage catheter just covers the occluded segment of the biliary tree. In the future, biliary drainage catheters may need to be modified with additional sideholes to provide more broad coverage. Electronically Signed   By: Jacqulynn Cadet M.D.   On: 07/10/2021 15:55   US Abdomen Limited RUQ (LIVER/GB)  Result Date: 07/11/2021 CLINICAL DATA:  70 year old male with history of cirrhosis and recently diagnosed infiltrative hepatic mass, postprocedure day #1 after left-sided percutaneous internal external biliary drain, now with diffuse severe abdominal pain. EXAM: ULTRASOUND ABDOMEN LIMITED RIGHT UPPER QUADRANT COMPARISON:  07/10/2021, 07/07/2021 FINDINGS: Gallbladder: Scattered echogenic gallstones, the largest measuring up to 8 mm. No gallbladder distension, significant wall thickening, or pericholecystic fluid. No sonographic Percell Miller sign reported. Common bile duct: Not visualized. Liver: Mild diffuse intrahepatic biliary ductal dilation, equal in the right and left lobes. The known hepatic mass is not well appreciated sonographically. Heterogeneous hepatic parenchyma with nodular contour. Portal vein is patent on color Doppler imaging with normal direction of blood flow towards the liver. Other: No perihepatic ascites. IMPRESSION: 1. No acute right upper quadrant abnormality. 2. Similar to slightly decreased mild diffuse intrahepatic biliary ductal  dilation from MRI comparison after left-sided biliary drain placement. 3. The known infiltrative hepatic mass in segment 8 is not well demonstrated sonographically. 4. Cholelithiasis. Ruthann Cancer, MD [facility] Electronically Signed   By: Ruthann Cancer M.D.   On: 07/11/2021 16:33     Procedures:  Percutaneous biliary drain.   Subjective: Patient is feeling better, no nausea or vomiting, abdominal pain has improved. This am has back pain.   Discharge Exam: Vitals:   07/14/21 0502 07/14/21 0748  BP: 121/89 (!) 125/97  Pulse: 98 92  Resp: 20 16  Temp: 98.7 F (37.1 C) 97.9 F (36.6 C)  SpO2: 93% 93%   Vitals:   07/13/21 2004 07/14/21 0040 07/14/21 0502 07/14/21 0748  BP: (!) 124/93 (!) 121/96 121/89 (!) 125/97  Pulse: 91 90 98 92  Resp: '20  20 16  '$ Temp: 98.3 F (36.8 C) 98.1 F (36.7 C) 98.7 F (37.1 C) 97.9 F (36.6 C)  TempSrc: Oral Oral Oral Oral  SpO2: 95% 94% 93% 93%  Weight:      Height:        General: Not in pain or dyspnea, deconditioned  Neurology: Awake and alert, non focal  E ENT: no pallor, mild icterus, oral mucosa moist Cardiovascular: No JVD. S1-S2 present, rhythmic, no gallops, rubs, or  murmurs. No lower extremity edema. Pulmonary: positive breath sounds bilaterally, with, no wheezing, rhonchi or rales. Gastrointestinal. Abdomen mild distended but not tender, biliary drain in place  Skin. No rashes Musculoskeletal: no joint deformities   The results of significant diagnostics from this hospitalization (including imaging, microbiology, ancillary and laboratory) are listed below for reference.     Microbiology: Recent Results (from the past 240 hour(s))  Resp Panel by RT-PCR (Flu A&B, Covid) Nasopharyngeal Swab     Status: None   Collection Time: 07/06/21  7:15 PM   Specimen: Nasopharyngeal Swab; Nasopharyngeal(NP) swabs in vial transport medium  Result Value Ref Range Status   SARS  Coronavirus 2 by RT PCR NEGATIVE NEGATIVE Final    Comment: (NOTE) SARS-CoV-2 target nucleic acids are NOT DETECTED.  The SARS-CoV-2 RNA is generally detectable in upper respiratory specimens during the acute phase of infection. The lowest concentration of SARS-CoV-2 viral copies this assay can detect is 138 copies/mL. A negative result does not preclude SARS-Cov-2 infection and should not be used as the sole basis for treatment or other patient management decisions. A negative result may occur with  improper specimen collection/handling, submission of specimen other than nasopharyngeal swab, presence of viral mutation(s) within the areas targeted by this assay, and inadequate number of viral copies(<138 copies/mL). A negative result must be combined with clinical observations, patient history, and epidemiological information. The expected result is Negative.  Fact Sheet for Patients:  EntrepreneurPulse.com.au  Fact Sheet for Healthcare Providers:  IncredibleEmployment.be  This test is no t yet approved or cleared by the [place] FDA and  has been authorized for detection and/or diagnosis of SARS-CoV-2 by FDA under an Emergency Use Authorization (EUA). This EUA will remain  in effect (meaning this test can be used) for the duration of the COVID-19 declaration under Section 564(b)(1) of the Act, 21 U.S.C.section 360bbb-3(b)(1), unless the authorization is terminated  or revoked sooner.       Influenza A by PCR NEGATIVE NEGATIVE Final   Influenza B by PCR NEGATIVE NEGATIVE Final    Comment: (NOTE) The Xpert Xpress SARS-CoV-2/FLU/RSV plus assay is intended as an aid in the diagnosis of influenza from Nasopharyngeal swab specimens and should not be used as a sole basis for treatment. Nasal washings and aspirates are unacceptable for Xpert Xpress SARS-CoV-2/FLU/RSV testing.  Fact Sheet for  Patients: EntrepreneurPulse.com.au  Fact Sheet for Healthcare Providers: IncredibleEmployment.be  This test is not yet approved or cleared by the [place] FDA and has been authorized for detection and/or diagnosis of SARS-CoV-2 by FDA under an Emergency Use Authorization (EUA). This EUA will remain in effect (meaning this test can be used) for the duration of the COVID-19 declaration under Section 564(b)(1) of the Act, 21 U.S.C. section 360bbb-3(b)(1), unless the authorization is terminated or revoked.  Performed at [facility], [place] [address], [place], [place] [address]      Labs: BNP (last 3 results) No results for input(s): BNP in the last 8760 hours. Basic Metabolic Panel: Recent Labs  Lab 07/08/21 0117 07/09/21 0055 07/10/21 0123 07/11/21 0109 07/11/21 1414 07/12/21 0138 07/13/21 0144 07/14/21 0255  NA 135 135 137 134* 134* 134* 132* 132*  K 3.5 3.6 3.6 4.1 4.0 5.0 4.4 4.3  CL 103 105 105 102 103 99 105 102  CO2 '27 24 25 25 '$ 21* 23 16* 19*  GLUCOSE 186* 152* 136* 174* 131* 120* 129* 137*  BUN '12 10 13 22 '$ 30* 37* 50* 49*  CREATININE 0.83  0.74 0.85 1.01 1.26* 2.82* 1.79* 1.20  CALCIUM 8.2* 8.5* 8.7* 9.4 9.8 10.1 9.7 9.6  MG 2.0 1.9 1.9 2.2  --  2.5*  --   --    Liver Function Tests: Recent Labs  Lab 07/11/21 0109 07/11/21 1414 07/12/21 0138 07/13/21 0144 07/14/21 0255  AST 123* 115* 100* 77* 74*  ALT 126* 125* 117* 91* 81*  ALKPHOS 271* 277* 271* 234* 234*  BILITOT 5.0* 5.2* 4.8* 4.8* 4.6*  PROT 7.5 8.5* 8.6* 8.5* 8.0  ALBUMIN 3.1* 3.4* 3.4* 3.2* 3.0*   Recent Labs  Lab 07/11/21 1414 07/12/21 0138 07/13/21 0144 07/14/21 0255  LIPASE 3,155* 1,833* 771* 424*   No results for input(s): AMMONIA in the last 168 hours. CBC: Recent Labs  Lab 07/11/21 0109 07/11/21 1414 07/12/21 0138 07/13/21 0144 07/14/21 0255  WBC 7.2 10.4 11.3* 13.5* 9.3  HGB 16.1 17.9* 18.1* 18.0* 17.4*  HCT 47.5 51.7 52.8* 51.9  51.2  MCV 94.8 93.7 95.5 94.4 94.8  PLT 123* 147* 149* 169 160   Cardiac Enzymes: No results for input(s): CKTOTAL, CKMB, CKMBINDEX, TROPONINI in the last 168 hours. BNP: Invalid input(s): POCBNP CBG: Recent Labs  Lab 07/13/21 1129 07/13/21 1611 07/13/21 2024 07/14/21 0759 07/14/21 1129  GLUCAP 177* 187* 116* 161* 185*   D-Dimer No results for input(s): DDIMER in the last 72 hours. Hgb A1c No results for input(s): HGBA1C in the last 72 hours. Lipid Profile No results for input(s): CHOL, HDL, LDLCALC, TRIG, CHOLHDL, LDLDIRECT in the last 72 hours. Thyroid function studies No results for input(s): TSH, T4TOTAL, T3FREE, THYROIDAB in the last 72 hours.  Invalid input(s): FREET3 Anemia work up No results for input(s): VITAMINB12, FOLATE, FERRITIN, TIBC, IRON, RETICCTPCT in the last 72 hours. Urinalysis    Component Value Date/Time   COLORURINE AMBER (A) 07/06/2021 2000   APPEARANCEUR CLEAR 07/06/2021 2000   LABSPEC 1.039 (H) 07/06/2021 2000   PHURINE 5.0 07/06/2021 2000   GLUCOSEU NEGATIVE 07/06/2021 2000   HGBUR NEGATIVE 07/06/2021 2000   BILIRUBINUR SMALL (A) 07/06/2021 2000   KETONESUR NEGATIVE 07/06/2021 2000   PROTEINUR NEGATIVE 07/06/2021 2000   NITRITE NEGATIVE 07/06/2021 2000   LEUKOCYTESUR TRACE (A) 07/06/2021 2000   Sepsis Labs Invalid input(s): PROCALCITONIN,  WBC,  LACTICIDVEN Microbiology Recent Results (from the past 240 hour(s))  Resp Panel by RT-PCR (Flu A&B, Covid) Nasopharyngeal Swab     Status: None   Collection Time: 07/06/21  7:15 PM   Specimen: Nasopharyngeal Swab; Nasopharyngeal(NP) swabs in vial transport medium  Result Value Ref Range Status   SARS Coronavirus 2 by RT PCR NEGATIVE NEGATIVE Final    Comment: (NOTE) SARS-CoV-2 target nucleic acids are NOT DETECTED.  The SARS-CoV-2 RNA is generally detectable in upper respiratory specimens during the acute phase of infection. The lowest concentration of SARS-CoV-2 viral copies this assay can  detect is 138 copies/mL. A negative result does not preclude SARS-Cov-2 infection and should not be used as the sole basis for treatment or other patient management decisions. A negative result may occur with  improper specimen collection/handling, submission of specimen other than nasopharyngeal swab, presence of viral mutation(s) within the areas targeted by this assay, and inadequate number of viral copies(<138 copies/mL). A negative result must be combined with clinical observations, patient history, and epidemiological information. The expected result is Negative.  Fact Sheet for Patients:  EntrepreneurPulse.com.au  Fact Sheet for Healthcare Providers:  IncredibleEmployment.be  This test is no t yet approved or cleared by the [place] FDA  and  has been authorized for detection and/or diagnosis of SARS-CoV-2 by FDA under an Emergency Use Authorization (EUA). This EUA will remain  in effect (meaning this test can be used) for the duration of the COVID-19 declaration under Section 564(b)(1) of the Act, 21 U.S.C.section 360bbb-3(b)(1), unless the authorization is terminated  or revoked sooner.       Influenza A by PCR NEGATIVE NEGATIVE Final   Influenza B by PCR NEGATIVE NEGATIVE Final    Comment: (NOTE) The Xpert Xpress SARS-CoV-2/FLU/RSV plus assay is intended as an aid in the diagnosis of influenza from Nasopharyngeal swab specimens and should not be used as a sole basis for treatment. Nasal washings and aspirates are unacceptable for Xpert Xpress SARS-CoV-2/FLU/RSV testing.  Fact Sheet for Patients: EntrepreneurPulse.com.au  Fact Sheet for Healthcare Providers: IncredibleEmployment.be  This test is not yet approved or cleared by the [place] FDA and has been authorized for detection and/or diagnosis of SARS-CoV-2 by FDA under an Emergency Use Authorization (EUA). This EUA will remain in  effect (meaning this test can be used) for the duration of the COVID-19 declaration under Section 564(b)(1) of the Act, 21 U.S.C. section 360bbb-3(b)(1), unless the authorization is terminated or revoked.  Performed at [facility], [place] [address] [place], [place] [address]      Time coordinating discharge: 45 minutes  SIGNED:   Tawni Millers, MD  [facility] 07/14/2021, 12:35 PM

## 2021-07-14 NOTE — Progress Notes (Signed)
Phillip Saunders to be D/C'd  per MD order.  Discussed with the patient and all questions fully answered.  VSS, Skin clean, dry and intact without evidence of skin break down, no evidence of skin tears noted.  IV catheter discontinued intact. Site without signs and symptoms of complications. Dressing and pressure applied.  An After Visit Summary was printed and given to the patient. Patient received prescription.  D/c education completed with patient/family including follow up instructions, medication list, d/c activities limitations if indicated, with other d/c instructions as indicated by MD - patient able to verbalize understanding, all questions fully answered.   Patient instructed to return to ED, call 911, or call MD for any changes in condition.   Patient to be escorted via [place], and D/C home via private auto.

## 2021-07-16 ENCOUNTER — Emergency Department (HOSPITAL_COMMUNITY): Payer: Medicare HMO

## 2021-07-16 ENCOUNTER — Other Ambulatory Visit: Payer: Self-pay

## 2021-07-16 ENCOUNTER — Telehealth: Payer: Self-pay | Admitting: *Deleted

## 2021-07-16 ENCOUNTER — Inpatient Hospital Stay (HOSPITAL_COMMUNITY)
Admission: EM | Admit: 2021-07-16 | Discharge: 2021-07-19 | DRG: 871 | Disposition: E | Payer: Medicare HMO | Attending: Pulmonary Disease | Admitting: Pulmonary Disease

## 2021-07-16 DIAGNOSIS — E782 Mixed hyperlipidemia: Secondary | ICD-10-CM | POA: Diagnosis present

## 2021-07-16 DIAGNOSIS — N179 Acute kidney failure, unspecified: Secondary | ICD-10-CM | POA: Diagnosis present

## 2021-07-16 DIAGNOSIS — E1165 Type 2 diabetes mellitus with hyperglycemia: Secondary | ICD-10-CM | POA: Diagnosis present

## 2021-07-16 DIAGNOSIS — D751 Secondary polycythemia: Secondary | ICD-10-CM | POA: Diagnosis present

## 2021-07-16 DIAGNOSIS — A419 Sepsis, unspecified organism: Principal | ICD-10-CM | POA: Diagnosis present

## 2021-07-16 DIAGNOSIS — E210 Primary hyperparathyroidism: Secondary | ICD-10-CM | POA: Diagnosis present

## 2021-07-16 DIAGNOSIS — Z20822 Contact with and (suspected) exposure to covid-19: Secondary | ICD-10-CM | POA: Diagnosis present

## 2021-07-16 DIAGNOSIS — E872 Acidosis, unspecified: Secondary | ICD-10-CM

## 2021-07-16 DIAGNOSIS — C221 Intrahepatic bile duct carcinoma: Secondary | ICD-10-CM | POA: Diagnosis present

## 2021-07-16 DIAGNOSIS — Z66 Do not resuscitate: Secondary | ICD-10-CM | POA: Diagnosis present

## 2021-07-16 DIAGNOSIS — R6521 Severe sepsis with septic shock: Secondary | ICD-10-CM

## 2021-07-16 DIAGNOSIS — E860 Dehydration: Secondary | ICD-10-CM | POA: Diagnosis present

## 2021-07-16 DIAGNOSIS — Z791 Long term (current) use of non-steroidal anti-inflammatories (NSAID): Secondary | ICD-10-CM

## 2021-07-16 DIAGNOSIS — K7460 Unspecified cirrhosis of liver: Secondary | ICD-10-CM | POA: Diagnosis present

## 2021-07-16 DIAGNOSIS — K831 Obstruction of bile duct: Secondary | ICD-10-CM | POA: Diagnosis present

## 2021-07-16 DIAGNOSIS — Z79899 Other long term (current) drug therapy: Secondary | ICD-10-CM

## 2021-07-16 DIAGNOSIS — I10 Essential (primary) hypertension: Secondary | ICD-10-CM | POA: Diagnosis present

## 2021-07-16 LAB — I-STAT CHEM 8, ED
BUN: 119 mg/dL — ABNORMAL HIGH (ref 8–23)
Calcium, Ion: 1.04 mmol/L — ABNORMAL LOW (ref 1.15–1.40)
Chloride: 99 mmol/L (ref 98–111)
Creatinine, Ser: 5.3 mg/dL — ABNORMAL HIGH (ref 0.61–1.24)
Glucose, Bld: 398 mg/dL — ABNORMAL HIGH (ref 70–99)
HCT: 65 % — ABNORMAL HIGH (ref 39.0–52.0)
Hemoglobin: 22.1 g/dL (ref 13.0–17.0)
Potassium: 4.8 mmol/L (ref 3.5–5.1)
Sodium: 126 mmol/L — ABNORMAL LOW (ref 135–145)
TCO2: 6 mmol/L — ABNORMAL LOW (ref 22–32)

## 2021-07-16 LAB — I-STAT VENOUS BLOOD GAS, ED
Acid-base deficit: 27 mmol/L — ABNORMAL HIGH (ref 0.0–2.0)
Bicarbonate: 3.7 mmol/L — ABNORMAL LOW (ref 20.0–28.0)
Calcium, Ion: 1.06 mmol/L — ABNORMAL LOW (ref 1.15–1.40)
HCT: 56 % — ABNORMAL HIGH (ref 39.0–52.0)
Hemoglobin: 19 g/dL — ABNORMAL HIGH (ref 13.0–17.0)
O2 Saturation: 79 %
Potassium: 5 mmol/L (ref 3.5–5.1)
Sodium: 127 mmol/L — ABNORMAL LOW (ref 135–145)
TCO2: <5 mmol/L — ABNORMAL LOW (ref 22–32)
pCO2, Ven: 16.1 mmHg — CL (ref 44.0–60.0)
pH, Ven: 6.969 — CL (ref 7.250–7.430)
pO2, Ven: 66 mmHg — ABNORMAL HIGH (ref 32.0–45.0)

## 2021-07-16 LAB — CBC WITH DIFFERENTIAL/PLATELET
Abs Immature Granulocytes: 0.19 10*3/uL — ABNORMAL HIGH (ref 0.00–0.07)
Basophils Absolute: 0.1 10*3/uL (ref 0.0–0.1)
Basophils Relative: 1 %
Eosinophils Absolute: 0 10*3/uL (ref 0.0–0.5)
Eosinophils Relative: 0 %
HCT: 63 % — ABNORMAL HIGH (ref 39.0–52.0)
Hemoglobin: 20.2 g/dL — ABNORMAL HIGH (ref 13.0–17.0)
Immature Granulocytes: 1 %
Lymphocytes Relative: 13 %
Lymphs Abs: 1.8 10*3/uL (ref 0.7–4.0)
MCH: 32.6 pg (ref 26.0–34.0)
MCHC: 32.1 g/dL (ref 30.0–36.0)
MCV: 101.6 fL — ABNORMAL HIGH (ref 80.0–100.0)
Monocytes Absolute: 1.1 10*3/uL — ABNORMAL HIGH (ref 0.1–1.0)
Monocytes Relative: 8 %
Neutro Abs: 10.4 10*3/uL — ABNORMAL HIGH (ref 1.7–7.7)
Neutrophils Relative %: 77 %
Platelets: 307 10*3/uL (ref 150–400)
RBC: 6.2 MIL/uL — ABNORMAL HIGH (ref 4.22–5.81)
RDW: 13.8 % (ref 11.5–15.5)
WBC: 13.6 10*3/uL — ABNORMAL HIGH (ref 4.0–10.5)
nRBC: 0 % (ref 0.0–0.2)

## 2021-07-16 LAB — COMPREHENSIVE METABOLIC PANEL
ALT: 97 U/L — ABNORMAL HIGH (ref 0–44)
AST: 93 U/L — ABNORMAL HIGH (ref 15–41)
Albumin: 3.5 g/dL (ref 3.5–5.0)
Alkaline Phosphatase: 259 U/L — ABNORMAL HIGH (ref 38–126)
BUN: 113 mg/dL — ABNORMAL HIGH (ref 8–23)
CO2: <7 mmol/L — ABNORMAL LOW (ref 22–32)
Calcium: 11.1 mg/dL — ABNORMAL HIGH (ref 8.9–10.3)
Chloride: 84 mmol/L — ABNORMAL LOW (ref 98–111)
Creatinine, Ser: 4.77 mg/dL — ABNORMAL HIGH (ref 0.61–1.24)
GFR, Estimated: 13 mL/min — ABNORMAL LOW (ref >60)
Glucose, Bld: 358 mg/dL — ABNORMAL HIGH (ref 70–99)
Potassium: 5.2 mmol/L — ABNORMAL HIGH (ref 3.5–5.1)
Sodium: 127 mmol/L — ABNORMAL LOW (ref 135–145)
Total Bilirubin: 4.8 mg/dL — ABNORMAL HIGH (ref 0.3–1.2)
Total Protein: 9.6 g/dL — ABNORMAL HIGH (ref 6.5–8.1)

## 2021-07-16 LAB — RESP PANEL BY RT-PCR (FLU A&B, COVID) ARPGX2
Influenza A by PCR: NEGATIVE
Influenza B by PCR: NEGATIVE
SARS Coronavirus 2 by RT PCR: NEGATIVE

## 2021-07-16 LAB — CBG MONITORING, ED: Glucose-Capillary: 441 mg/dL — ABNORMAL HIGH (ref 70–99)

## 2021-07-16 LAB — LIPASE, BLOOD: Lipase: 426 U/L — ABNORMAL HIGH (ref 11–51)

## 2021-07-16 LAB — LACTIC ACID, PLASMA
Lactic Acid, Venous: >11 mmol/L (ref 0.5–1.9)
Lactic Acid, Venous: >11 mmol/L (ref 0.5–1.9)

## 2021-07-16 LAB — PROTIME-INR
INR: 1.6 — ABNORMAL HIGH (ref 0.8–1.2)
Prothrombin Time: 19 seconds — ABNORMAL HIGH (ref 11.4–15.2)

## 2021-07-16 LAB — ANTINUCLEAR ANTIBODIES, IFA: ANA Ab, IFA: NEGATIVE

## 2021-07-16 LAB — APTT: aPTT: 26 seconds (ref 24–36)

## 2021-07-16 MED ORDER — LACTATED RINGERS IV BOLUS (SEPSIS)
1000.0000 mL | Freq: Once | INTRAVENOUS | Status: AC
  Administered 2021-07-16: 1000 mL via INTRAVENOUS

## 2021-07-16 MED ORDER — HEPARIN SODIUM (PORCINE) 5000 UNIT/ML IJ SOLN: 5000.0000 [IU] | Freq: Three times a day (TID) | INTRAMUSCULAR | Status: DC

## 2021-07-16 MED ORDER — STERILE WATER FOR INJECTION IV SOLN
INTRAVENOUS | Status: DC
  Filled 2021-07-16 (×2): qty 1000

## 2021-07-16 MED ORDER — SODIUM CHLORIDE 0.9 % IV SOLN: 2.0000 g | INTRAVENOUS | Status: DC

## 2021-07-16 MED ORDER — VANCOMYCIN HCL 1750 MG/350ML IV SOLN
1750.0000 mg | Freq: Once | INTRAVENOUS | Status: AC
  Administered 2021-07-16: 1750 mg via INTRAVENOUS
  Filled 2021-07-16: qty 350

## 2021-07-16 MED ORDER — VANCOMYCIN VARIABLE DOSE PER UNSTABLE RENAL FUNCTION (PHARMACIST DOSING): Status: DC

## 2021-07-16 MED ORDER — METRONIDAZOLE 500 MG/100ML IV SOLN
500.0000 mg | Freq: Once | INTRAVENOUS | Status: AC
  Administered 2021-07-16: 500 mg via INTRAVENOUS
  Filled 2021-07-16: qty 100

## 2021-07-16 MED ORDER — SODIUM BICARBONATE 8.4 % IV SOLN
100.0000 meq | Freq: Once | INTRAVENOUS | Status: AC
  Administered 2021-07-16: 100 meq via INTRAVENOUS
  Filled 2021-07-16: qty 50

## 2021-07-16 MED ORDER — POLYETHYLENE GLYCOL 3350 17 G PO PACK: 17.0000 g | PACK | Freq: Every day | ORAL | Status: DC | PRN

## 2021-07-16 MED ORDER — FENTANYL CITRATE PF 50 MCG/ML IJ SOSY
25.0000 ug | PREFILLED_SYRINGE | INTRAMUSCULAR | Status: DC | PRN
  Filled 2021-07-16: qty 1

## 2021-07-16 MED ORDER — DOCUSATE SODIUM 100 MG PO CAPS: 100.0000 mg | ORAL_CAPSULE | Freq: Two times a day (BID) | ORAL | Status: DC | PRN

## 2021-07-16 MED ORDER — FENTANYL CITRATE PF 50 MCG/ML IJ SOSY
50.0000 ug | PREFILLED_SYRINGE | Freq: Once | INTRAMUSCULAR | Status: AC
  Administered 2021-07-16: 50 ug via INTRAVENOUS
  Filled 2021-07-16: qty 1

## 2021-07-16 MED ORDER — SODIUM CHLORIDE 0.9 % IV SOLN
2.0000 g | Freq: Once | INTRAVENOUS | Status: AC
  Administered 2021-07-16: 2 g via INTRAVENOUS
  Filled 2021-07-16: qty 2

## 2021-07-16 NOTE — Sepsis Progress Note (Signed)
Following per sepsis protocol   

## 2021-07-16 NOTE — Progress Notes (Signed)
Pharmacy Antibiotic Note  Phillip Saunders is a 70 y.o. male admitted on 07/06/2021 with sepsis.  Pharmacy has been consulted for vancomycin and cefepime dosing.  Plan: Vancomycin '1750mg'$  x1 then vanc variable dosing, no standing regimen given severity of AKI (Cr up to 5.3, bsl around 0.7-0.'8mg'$ /dL) Cefepime 2g IV q24h -Monitor renal function, clinical status, and antibiotic plan  Temp (24hrs), Avg:94.4 F (34.7 C), Min:94.4 F (34.7 C), Max:94.4 F (34.7 C)  Recent Labs  Lab 07/11/21 1414 07/12/21 0138 07/13/21 0144 07/14/21 0255 06/30/2021 1800 07/12/2021 1930 07/15/2021 1931  WBC 10.4 11.3* 13.5* 9.3 13.6*  --   --   CREATININE 1.26* 2.82* 1.79* 1.20 4.77*  --  5.30*  LATICACIDVEN  --   --   --   --  >11.0* >11.0*  --     Estimated Creatinine Clearance: 13.2 mL/min (A) (by C-G formula based on SCr of 5.3 mg/dL (H)).    No Known Allergies  Antimicrobials this admission: Vanc 8/29 >>  Cefepime 8/29 >>   Microbiology results: 8/29 BCx: pending 8/29 UCx: pending   Thank you for allowing pharmacy to be a part of this patient's care.  Joetta Manners, PharmD, [facility] Emergency Medicine Clinical Pharmacist ED RPh Phone: [place]: [phone]

## 2021-07-16 NOTE — ED Notes (Signed)
Family at bedside. 

## 2021-07-16 NOTE — H&P (Signed)
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  He had percutaneous biliary drain placed which was complicated postprocedure pancreatitis & AKI , seen by oncology and discharged with plan for outpatient chemotherapy after multidisciplinary discussion.  Imaging also showed liver cirrhosis with findings suspicious for infiltrative hepatocellular carcinoma. He was readmitted within 2 days with vomiting, shortness of breath and found to have AKI with severe metabolic acidosis and lactic acidosis Venous pH was 6.97, sodium 127, sugars elevated at 400 range, new renal failure with BUN 119 creatinine 5.30, lactate more than 11, mild leukocytosis and severe polycythemia with hemoglobin of 20.2  Pertinent  Medical History  Hypertension-irbesartan stopped last admission Cirrhosis Diabetes type 2 -metformin stopped last admission  Significant Hospital Events: Including procedures, antibiotic start and stop dates in addition to other pertinent events     Interim History / Subjective:  Restless, confused, agitated cannot find a comfortable position in bed Wife at bedside very distraught  Objective   Blood pressure 101/74, pulse (!) 128, temperature (!) 94.4 F (34.7 C), temperature source Rectal, resp. rate (!) 25, SpO2 91 %.       No intake or output data in the 24 hours ending 07/08/2021 2255 There were no vitals filed for this visit.  Examination: General: Elderly man, in moderate distress moving around in bed HENT: No pallor, mild icterus, no JVD, dry mucosa Lungs: Mild accessory muscle use, no rhonchi Cardiovascular: S1-S2 mild tacky, sinus on monitor Abdomen: Soft,  nontender, midline drain with bilious fluid Extremities: Mottling of extremities, no deformity Neuro: Awake, follows one-step commands, no meningeal signs Skin-mottling of both lower extremities up to thigh , red erythematous rash over back  Resolved Hospital Problem list     Assessment & Plan:  He has developed new onset renal failure.  He had also developed AKI post percutaneous biliary drain which was felt to be due to ARB and increased biliary output.  He appears severely dehydrated and polycythemic suggesting hemoconcentration.  He has mild leukocytosis and biliary sepsis is possible.  Lactic acidosis could be related to occult shock or delayed clearance from liver failure  Septic shock/lactic acidosis -Received 3 L of fluids, use empiric antibiotics cefepime and Flagyl to cover biliary source -Follow serial lactates but expect delayed clearance  AKI/metabolic acidosis -We will start bicarbonate drip at 150 cc an hour and sterile water, use 100 mEq of bicarbonate IV  Hyperglycemia -doubt DKA will check beta hydroxybutyrate for completion. Use SSI  Cholangiocarcinoma with biliary obstruction status post biliary drain -Not a candidate for surgery in his current state -Multidisciplinary discussion pending but plan was for palliative therapy if not surgical candidate  DNR issued based on goals of care discussion*able  Best Practice (right click and "Reselect all SmartList Selections" daily)   Diet/type: NPO DVT prophylaxis: prophylactic heparin  GI prophylaxis: N/A Lines: N/A Foley:  Yes, and it is still needed Code Status:  DNR Last date of multidisciplinary goals of care discussion [8/29 ] wife Arbie Cookey of 64 years and son Ronalee Belts on the phone , they endorse that he has a living will and would not want life support.  Patient himself is confused and unable to participate in decision-making.  DNR issued  Labs   CBC: Recent Labs  Lab 07/11/21 1414 07/12/21 0138 07/13/21 0144  07/14/21 0255 06/20/2021 1800 06/22/2021 1931 06/26/2021 2036  WBC 10.4 11.3* 13.5* 9.3 13.6*  --   --   NEUTROABS  --   --   --   --  10.4*  --   --   HGB 17.9* 18.1* 18.0* 17.4* 20.2* 22.1* 19.0*  HCT 51.7 52.8* 51.9 51.2 63.0* 65.0* 56.0*  MCV 93.7 95.5 94.4 94.8 101.6*  --   --   PLT 147* 149* 169 160 307  --   --     Basic Metabolic Panel: Recent Labs  Lab 07/10/21 0123 07/11/21 0109 07/11/21 1414 07/12/21 0138 07/13/21 0144 07/14/21 0255 07/01/2021 1800 07/03/2021 1931 06/19/2021 2036  NA 137 134* 134* 134* 132* 132* 127* 126* 127*  K 3.6 4.1 4.0 5.0 4.4 4.3 5.2* 4.8 5.0  CL 105 102 103 99 105 102 84* 99  --   CO2 25 25 21* 23 16* 19* <7*  --   --   GLUCOSE 136* 174* 131* 120* 129* 137* 358* 398*  --   BUN 13 22 30* 37* 50* 49* 113* 119*  --   CREATININE 0.85 1.01 1.26* 2.82* 1.79* 1.20 4.77* 5.30*  --   CALCIUM 8.7* 9.4 9.8 10.1 9.7 9.6 11.1*  --   --   MG 1.9 2.2  --  2.5*  --   --   --   --   --    GFR: Estimated Creatinine Clearance: 13.2 mL/min (A) (by C-G formula based on SCr of 5.3 mg/dL (H)). Recent Labs  Lab 07/12/21 0138 07/13/21 0144 07/14/21 0255 07/13/2021 1800 06/18/2021 1930  WBC 11.3* 13.5* 9.3 13.6*  --   LATICACIDVEN  --   --   --  >11.0* >11.0*    Liver Function Tests: Recent Labs  Lab 07/11/21 1414 07/12/21 0138 07/13/21 0144 07/14/21 0255 07/10/2021 1800  AST 115* 100* 77* 74* 93*  ALT 125* 117* 91* 81* 97*  ALKPHOS 277* 271* 234* 234* 259*  BILITOT 5.2* 4.8* 4.8* 4.6* 4.8*  PROT 8.5* 8.6* 8.5* 8.0 9.6*  ALBUMIN 3.4* 3.4* 3.2* 3.0* 3.5   Recent Labs  Lab 07/11/21 1414 07/12/21 0138 07/13/21 0144 07/14/21 0255 06/30/2021 1800  LIPASE 3,155* 1,833* 771* 424* 426*   No results for input(s): AMMONIA in the last 168 hours.  ABG    Component Value Date/Time   HCO3 3.7 (L) 07/09/2021 2036   TCO2 <5 (L) 07/09/2021 2036   ACIDBASEDEF 27.0 (H) 06/28/2021 2036   O2SAT 79.0 07/11/2021 2036     Coagulation Profile: No results for input(s):  INR, PROTIME in the last 168 hours.  Cardiac Enzymes: No results for input(s): CKTOTAL, CKMB, CKMBINDEX, TROPONINI in the last 168 hours.  HbA1C: Hgb A1c MFr Bld  Date/Time Value Ref Range Status  07/06/2021 11:24 PM 9.3 (H) 4.8 - 5.6 % Final    Comment:    (NOTE) Pre diabetes:          5.7%-6.4%  Diabetes:              >6.4%  Glycemic control for   <7.0% adults with diabetes     CBG: Recent Labs  Lab 07/13/21 1611 07/13/21 2024 07/14/21 0759 07/14/21 1129 06/19/2021 1918  GLUCAP 187* 116* 161* 185* 441*    Review of Systems:   Unable to obtain since patient is confused mildly agitated moving around in bed, restless Complains of difficulty breathing No chest  pain, palpitations Vomited bile Decreased urine output  Past Medical History:  He,  has a past medical history of DM2 (diabetes mellitus, type 2) ([place]), Hypertension, and Primary hyperparathyroidism ([place]).   Surgical History:   Past Surgical History:  Procedure Laterality Date   APPENDECTOMY     HERNIA REPAIR     IR ENDOLUMINAL BX OF BILIARY TREE  07/10/2021   IR INT EXT BILIARY DRAIN WITH CHOLANGIOGRAM  07/10/2021   IR US GUIDE BX ASP/DRAIN  07/10/2021   PARATHYROIDECTOMY     VASECTOMY       Social History:   reports that he has never smoked. He has never used smokeless tobacco. He reports that he does not currently use alcohol. He reports that he does not use drugs.   Family History:  His family history is negative for Pancreatic cancer.   Allergies No Known Allergies   Home Medications  Prior to Admission medications   Medication Sig Start Date End Date Taking? Authorizing Provider  acetaminophen (TYLENOL) 500 MG tablet Take 1 tablet (500 mg total) by mouth every 6 (six) hours as needed for mild pain or moderate pain. 07/14/21  Yes Arrien, Jimmy Picket, MD  atorvastatin (LIPITOR) 40 MG tablet Take 40 mg by mouth at bedtime. 03/22/16  Yes [provider]  metFORMIN (GLUCOPHAGE) 1000 MG  tablet Take 1,000 mg by mouth 2 (two) times daily with a meal. 06/21/21  Yes [provider]  RYBELSUS 7 MG TABS Take 7 mg by mouth every morning. 06/25/21  Yes [provider]  sodium bicarbonate 650 MG tablet Take 1 tablet (650 mg total) by mouth 2 (two) times daily. 07/14/21 08/13/21 Yes Arrien, Jimmy Picket, MD  traMADol (ULTRAM) 50 MG tablet Take 1 tablet (50 mg total) by mouth every 6 (six) hours as needed for moderate pain. 07/14/21  Yes Arrien, Jimmy Picket, MD     Critical care time: 51m    RKara MeadMD. [facility] Pager : [phone]  If no response to pager , please call [phone] until 7 pm After 7:00 pm call Elink  [phone]  07/05/2021

## 2021-07-16 NOTE — Progress Notes (Signed)
Opened in error

## 2021-07-16 NOTE — ED Provider Notes (Signed)
[facility] EMERGENCY DEPARTMENT Provider Note   CSN: [id number] Arrival date & time: 07/18/2021  1745  LEVEL 5 CAVEAT - ALTERED MENTAL STATUS   History Chief Complaint  Patient presents with   Nausea   Emesis   Post-op Problem    Phillip Saunders is a 70 y.o. male.  HPI 70 year old male presents with abdominal pain and vomiting.  Patient seems confused and so the history is limited as there is no other family present.  Chart review shows he was just discharged a couple days ago after being found to have biliary obstruction and now has a drain.  He reports that the drain output is darker and he started vomiting yesterday and has abdominal pain today.  He went to the bathroom and was found on the floor is unclear if he fell or not.  Further history is limited.  Past Medical History:  Diagnosis Date   DM2 (diabetes mellitus, type 2) ([place])    Hypertension    Primary hyperparathyroidism ([place])     Patient Active Problem List   Diagnosis Date Noted   Acute renal failure (ARF) ([place]) 07/07/2021   Pain of upper abdomen    Elevated CA 19-9 level    Elevated carcinoembryonic antigen (CEA)    Liver mass    Jaundice    Hepatobiliary tract disease 07/07/2021   Cirrhosis of liver ([place]) 07/06/2021   Acquired dilation of bile duct 07/06/2021   DM2 (diabetes mellitus, type 2) ([place]) 07/06/2021   Stress and adjustment reaction 11/15/2016   Hyperlipidemia, mixed 03/22/2016   Abnormal liver function test 03/19/2016   Fasting hyperglycemia 03/18/2016   Screening for lipid disorders 03/18/2016   Nephrolithiasis 03/05/2016   Screening for colon cancer 03/05/2016   Screening for prostate cancer 03/05/2016   Wellness examination 03/05/2016   Benign hypertension 03/04/2016   Lower urinary tract symptoms (LUTS) 03/04/2016    Past Surgical History:  Procedure Laterality Date   APPENDECTOMY     HERNIA REPAIR     IR ENDOLUMINAL BX OF BILIARY TREE  07/10/2021   IR INT EXT BILIARY  DRAIN WITH CHOLANGIOGRAM  07/10/2021   IR US GUIDE BX ASP/DRAIN  07/10/2021   PARATHYROIDECTOMY     VASECTOMY         Family History  Problem Relation Age of Onset   Pancreatic cancer Neg Hx     Social History   Tobacco Use   Smoking status: Never   Smokeless tobacco: Never  Substance Use Topics   Alcohol use: Not Currently   Drug use: Never    Home Medications Prior to Admission medications   Medication Sig Start Date End Date Taking? Authorizing Provider  acetaminophen (TYLENOL) 500 MG tablet Take 1 tablet (500 mg total) by mouth every 6 (six) hours as needed for mild pain or moderate pain. 07/14/21  Yes Arrien, Jimmy Picket, MD  atorvastatin (LIPITOR) 40 MG tablet Take 40 mg by mouth at bedtime. 03/22/16  Yes [provider]  metFORMIN (GLUCOPHAGE) 1000 MG tablet Take 1,000 mg by mouth 2 (two) times daily with a meal. 06/21/21  Yes [provider]  RYBELSUS 7 MG TABS Take 7 mg by mouth every morning. 06/25/21  Yes [provider]  sodium bicarbonate 650 MG tablet Take 1 tablet (650 mg total) by mouth 2 (two) times daily. 07/14/21 08/13/21 Yes Arrien, Jimmy Picket, MD  traMADol (ULTRAM) 50 MG tablet Take 1 tablet (50 mg total) by mouth every 6 (six) hours as needed for  moderate pain. 07/14/21  Yes Arrien, Jimmy Picket, MD    Allergies    Patient has no known allergies.  Review of Systems   Review of Systems  Unable to perform ROS: Mental status change   Physical Exam Updated Vital Signs BP (!) 123/96 (BP Location: Left Arm)   Pulse (!) 130   Temp (!) 94.4 F (34.7 C) (Rectal)   Resp (!) 29   SpO2 98%   Physical Exam Vitals and nursing note reviewed.  Constitutional:      Appearance: He is well-developed. He is ill-appearing.  HENT:     Head: Normocephalic and atraumatic.     Right Ear: External ear normal.     Left Ear: External ear normal.     Nose: Nose normal.     Mouth/Throat:     Mouth: Mucous membranes are dry.  Eyes:      General:        Right eye: No discharge.        Left eye: No discharge.  Cardiovascular:     Rate and Rhythm: Regular rhythm. Tachycardia present.     Heart sounds: Normal heart sounds.  Pulmonary:     Effort: Tachypnea present.     Breath sounds: Normal breath sounds.  Abdominal:     Palpations: Abdomen is soft.     Tenderness: There is abdominal tenderness.     Comments: Biliary drain in place with dark fluid.  Has some mild upper abdominal tenderness but no guarding and has a soft abdomen.  Musculoskeletal:     Cervical back: Neck supple.  Skin:    General: Skin is warm and dry.  Neurological:     Mental Status: He is alert. He is disoriented.  Psychiatric:        Mood and Affect: Mood is not anxious.    ED Results / Procedures / Treatments   Labs (all labs ordered are listed, but only abnormal results are displayed) Labs Reviewed  CBC WITH DIFFERENTIAL/PLATELET - Abnormal; Notable for the following components:      Result Value   WBC 13.6 (*)    RBC 6.20 (*)    Hemoglobin 20.2 (*)    HCT 63.0 (*)    MCV 101.6 (*)    Neutro Abs 10.4 (*)    Monocytes Absolute 1.1 (*)    Abs Immature Granulocytes 0.19 (*)    All other components within normal limits  COMPREHENSIVE METABOLIC PANEL - Abnormal; Notable for the following components:   Sodium 127 (*)    Potassium 5.2 (*)    Chloride 84 (*)    CO2 <7 (*)    Glucose, Bld 358 (*)    BUN 113 (*)    Creatinine, Ser 4.77 (*)    Calcium 11.1 (*)    Total Protein 9.6 (*)    AST 93 (*)    ALT 97 (*)    Alkaline Phosphatase 259 (*)    Total Bilirubin 4.8 (*)    GFR, Estimated 13 (*)    All other components within normal limits  LIPASE, BLOOD - Abnormal; Notable for the following components:   Lipase 426 (*)    All other components within normal limits  LACTIC ACID, PLASMA - Abnormal; Notable for the following components:   Lactic Acid, Venous >11.0 (*)    All other components within normal limits  LACTIC ACID, PLASMA -  Abnormal; Notable for the following components:   Lactic Acid, Venous >11.0 (*)    All  other components within normal limits  PROTIME-INR - Abnormal; Notable for the following components:   Prothrombin Time 19.0 (*)    All other components within normal limits  CBG MONITORING, ED - Abnormal; Notable for the following components:   Glucose-Capillary 441 (*)    All other components within normal limits  I-STAT CHEM 8, ED - Abnormal; Notable for the following components:   Sodium 126 (*)    BUN 119 (*)    Creatinine, Ser 5.30 (*)    Glucose, Bld 398 (*)    Calcium, Ion 1.04 (*)    TCO2 6 (*)    Hemoglobin 22.1 (*)    HCT 65.0 (*)    All other components within normal limits  I-STAT VENOUS BLOOD GAS, ED - Abnormal; Notable for the following components:   pH, Ven 6.969 (*)    pCO2, Ven 16.1 (*)    pO2, Ven 66.0 (*)    Bicarbonate 3.7 (*)    TCO2 <5 (*)    Acid-base deficit 27.0 (*)    Sodium 127 (*)    Calcium, Ion 1.06 (*)    HCT 56.0 (*)    Hemoglobin 19.0 (*)    All other components within normal limits  RESP PANEL BY RT-PCR (FLU A&B, COVID) ARPGX2  CULTURE, BLOOD (ROUTINE X 2)  CULTURE, BLOOD (ROUTINE X 2)  URINE CULTURE  APTT  URINALYSIS, ROUTINE W REFLEX MICROSCOPIC  CBC  BASIC METABOLIC PANEL  MAGNESIUM  PHOSPHORUS    EKG None  Radiology CT ABDOMEN PELVIS WO CONTRAST  Result Date: 07/06/2021 CLINICAL DATA:  Unknown mental status change. EXAM: CT HEAD WITHOUT CONTRAST CT CHEST, ABDOMEN AND PELVIS WITHOUT CONTRAST TECHNIQUE: Contiguous axial images were obtained from the base of the skull through the vertex without intravenous contrast. Multidetector CT imaging of the chest, abdomen and pelvis was performed following the standard protocol without IV contrast. COMPARISON:  None. FINDINGS: Brain: No evidence of large-territorial acute infarction. No parenchymal hemorrhage. No mass lesion. No extra-axial collection. No mass effect or midline shift. No hydrocephalus.  Basilar cisterns are patent. Vascular: No hyperdense vessel. Skull: No acute fracture or focal lesion. Sinuses/Orbits: Mucosal thickening of left maxillary sinus. Paranasal sinuses and mastoid air cells are clear. The orbits are unremarkable. Other: None. CT CHEST FINDINGS Cardiovascular: Normal heart size. No significant pericardial effusion. Stable enlarged caliber of the ascending aorta measuring up to 4.4 cm. The descending thoracic aorta is normal in caliber. No atherosclerotic plaque of the thoracic aorta. No coronary artery calcifications. Mediastinum/Nodes: No enlarged mediastinal, hilar, or axillary lymph nodes. Thyroid gland, trachea, and esophagus demonstrate no significant findings. Lungs/Pleura: No focal consolidation. No pulmonary nodule. No pulmonary mass. No pleural effusion. No pneumothorax. Musculoskeletal: No chest wall abnormality. No suspicious lytic or blastic osseous lesions. No acute displaced fracture. Multilevel degenerative changes of the spine. CT ABDOMEN AND PELVIS FINDINGS Hepatobiliary: No focal liver abnormality. Calcified gallstone within the gallbladder lumen. Gas noted within the gallbladder lumen. Percutaneous transhepatic biliary drainage catheter with tip terminating within the second portion/third portion of the duodenum. Associated pneumobilia. No biliary dilatation. Pancreas: No focal lesion. Normal pancreatic contour. No surrounding inflammatory changes. No main pancreatic ductal dilatation. Spleen: Normal in size without focal abnormality. Adrenals/Urinary Tract: No adrenal nodule bilaterally. Bilateral renal cortical scarring. Punctate bilateral nephrolithiasis. No hydronephrosis. No definite contour-deforming renal mass. No ureterolithiasis or hydroureter. The urinary bladder is decompressed. Stomach/Bowel: Stomach is within normal limits. No evidence of bowel wall thickening or dilatation. The appendix not definitely identified.  No right lower quadrant inflammatory  changes suggest acute appendicitis. Motion artifact within right lower quadrant is noted. Vascular/Lymphatic: No abdominal aorta or iliac aneurysm. Mild atherosclerotic plaque of the aorta and its branches. No abdominal, pelvic, or inguinal lymphadenopathy. Reproductive: Prostate is prominent. Other: No intraperitoneal free fluid. No intraperitoneal free gas. No organized fluid collection. Musculoskeletal: Small fat containing right inguinal hernia. No suspicious lytic or blastic osseous lesions. No acute displaced fracture. Multilevel degenerative changes of the spine. IMPRESSION: 1. No acute intracranial abnormality. 2. Limited evaluation of the chest, abdomen, pelvis due to motion artifact and noncontrast study. 3. No acute intrathoracic abnormality. 4. Cholelithiasis. 5. Interval resolution biliary ductal dilatation status post percutaneous transhepatic biliary drainage catheter. 6. Stable ascending aorta thoracic aneurysm. Recommend annual imaging followup by CTA or MRA. This recommendation follows 2010 ACCF/AHA/AATS/ACR/ASA/SCA/SCAI/SIR/STS/SVM Guidelines for the Diagnosis and Management of Patients with Thoracic Aortic Disease. Circulation. 2010; [id number]. Aortic aneurysm NOS (ICD10-I71.9) Electronically Signed   By: Iven Finn M.D.   On: 06/27/2021 21:37   CT HEAD WO CONTRAST (5MM)  Result Date: 06/20/2021 CLINICAL DATA:  Unknown mental status change. EXAM: CT HEAD WITHOUT CONTRAST CT CHEST, ABDOMEN AND PELVIS WITHOUT CONTRAST TECHNIQUE: Contiguous axial images were obtained from the base of the skull through the vertex without intravenous contrast. Multidetector CT imaging of the chest, abdomen and pelvis was performed following the standard protocol without IV contrast. COMPARISON:  None. FINDINGS: Brain: No evidence of large-territorial acute infarction. No parenchymal hemorrhage. No mass lesion. No extra-axial collection. No mass effect or midline shift. No hydrocephalus. Basilar cisterns  are patent. Vascular: No hyperdense vessel. Skull: No acute fracture or focal lesion. Sinuses/Orbits: Mucosal thickening of left maxillary sinus. Paranasal sinuses and mastoid air cells are clear. The orbits are unremarkable. Other: None. CT CHEST FINDINGS Cardiovascular: Normal heart size. No significant pericardial effusion. Stable enlarged caliber of the ascending aorta measuring up to 4.4 cm. The descending thoracic aorta is normal in caliber. No atherosclerotic plaque of the thoracic aorta. No coronary artery calcifications. Mediastinum/Nodes: No enlarged mediastinal, hilar, or axillary lymph nodes. Thyroid gland, trachea, and esophagus demonstrate no significant findings. Lungs/Pleura: No focal consolidation. No pulmonary nodule. No pulmonary mass. No pleural effusion. No pneumothorax. Musculoskeletal: No chest wall abnormality. No suspicious lytic or blastic osseous lesions. No acute displaced fracture. Multilevel degenerative changes of the spine. CT ABDOMEN AND PELVIS FINDINGS Hepatobiliary: No focal liver abnormality. Calcified gallstone within the gallbladder lumen. Gas noted within the gallbladder lumen. Percutaneous transhepatic biliary drainage catheter with tip terminating within the second portion/third portion of the duodenum. Associated pneumobilia. No biliary dilatation. Pancreas: No focal lesion. Normal pancreatic contour. No surrounding inflammatory changes. No main pancreatic ductal dilatation. Spleen: Normal in size without focal abnormality. Adrenals/Urinary Tract: No adrenal nodule bilaterally. Bilateral renal cortical scarring. Punctate bilateral nephrolithiasis. No hydronephrosis. No definite contour-deforming renal mass. No ureterolithiasis or hydroureter. The urinary bladder is decompressed. Stomach/Bowel: Stomach is within normal limits. No evidence of bowel wall thickening or dilatation. The appendix not definitely identified. No right lower quadrant inflammatory changes suggest acute  appendicitis. Motion artifact within right lower quadrant is noted. Vascular/Lymphatic: No abdominal aorta or iliac aneurysm. Mild atherosclerotic plaque of the aorta and its branches. No abdominal, pelvic, or inguinal lymphadenopathy. Reproductive: Prostate is prominent. Other: No intraperitoneal free fluid. No intraperitoneal free gas. No organized fluid collection. Musculoskeletal: Small fat containing right inguinal hernia. No suspicious lytic or blastic osseous lesions. No acute displaced fracture. Multilevel degenerative changes of the spine. IMPRESSION: 1.  No acute intracranial abnormality. 2. Limited evaluation of the chest, abdomen, pelvis due to motion artifact and noncontrast study. 3. No acute intrathoracic abnormality. 4. Cholelithiasis. 5. Interval resolution biliary ductal dilatation status post percutaneous transhepatic biliary drainage catheter. 6. Stable ascending aorta thoracic aneurysm. Recommend annual imaging followup by CTA or MRA. This recommendation follows 2010 ACCF/AHA/AATS/ACR/ASA/SCA/SCAI/SIR/STS/SVM Guidelines for the Diagnosis and Management of Patients with Thoracic Aortic Disease. Circulation. 2010; [id number]. Aortic aneurysm NOS (ICD10-I71.9) Electronically Signed   By: Iven Finn M.D.   On: 06/26/2021 21:37   CT Chest Wo Contrast  Result Date: 07/05/2021 CLINICAL DATA:  Unknown mental status change. EXAM: CT HEAD WITHOUT CONTRAST CT CHEST, ABDOMEN AND PELVIS WITHOUT CONTRAST TECHNIQUE: Contiguous axial images were obtained from the base of the skull through the vertex without intravenous contrast. Multidetector CT imaging of the chest, abdomen and pelvis was performed following the standard protocol without IV contrast. COMPARISON:  None. FINDINGS: Brain: No evidence of large-territorial acute infarction. No parenchymal hemorrhage. No mass lesion. No extra-axial collection. No mass effect or midline shift. No hydrocephalus. Basilar cisterns are patent. Vascular: No  hyperdense vessel. Skull: No acute fracture or focal lesion. Sinuses/Orbits: Mucosal thickening of left maxillary sinus. Paranasal sinuses and mastoid air cells are clear. The orbits are unremarkable. Other: None. CT CHEST FINDINGS Cardiovascular: Normal heart size. No significant pericardial effusion. Stable enlarged caliber of the ascending aorta measuring up to 4.4 cm. The descending thoracic aorta is normal in caliber. No atherosclerotic plaque of the thoracic aorta. No coronary artery calcifications. Mediastinum/Nodes: No enlarged mediastinal, hilar, or axillary lymph nodes. Thyroid gland, trachea, and esophagus demonstrate no significant findings. Lungs/Pleura: No focal consolidation. No pulmonary nodule. No pulmonary mass. No pleural effusion. No pneumothorax. Musculoskeletal: No chest wall abnormality. No suspicious lytic or blastic osseous lesions. No acute displaced fracture. Multilevel degenerative changes of the spine. CT ABDOMEN AND PELVIS FINDINGS Hepatobiliary: No focal liver abnormality. Calcified gallstone within the gallbladder lumen. Gas noted within the gallbladder lumen. Percutaneous transhepatic biliary drainage catheter with tip terminating within the second portion/third portion of the duodenum. Associated pneumobilia. No biliary dilatation. Pancreas: No focal lesion. Normal pancreatic contour. No surrounding inflammatory changes. No main pancreatic ductal dilatation. Spleen: Normal in size without focal abnormality. Adrenals/Urinary Tract: No adrenal nodule bilaterally. Bilateral renal cortical scarring. Punctate bilateral nephrolithiasis. No hydronephrosis. No definite contour-deforming renal mass. No ureterolithiasis or hydroureter. The urinary bladder is decompressed. Stomach/Bowel: Stomach is within normal limits. No evidence of bowel wall thickening or dilatation. The appendix not definitely identified. No right lower quadrant inflammatory changes suggest acute appendicitis. Motion  artifact within right lower quadrant is noted. Vascular/Lymphatic: No abdominal aorta or iliac aneurysm. Mild atherosclerotic plaque of the aorta and its branches. No abdominal, pelvic, or inguinal lymphadenopathy. Reproductive: Prostate is prominent. Other: No intraperitoneal free fluid. No intraperitoneal free gas. No organized fluid collection. Musculoskeletal: Small fat containing right inguinal hernia. No suspicious lytic or blastic osseous lesions. No acute displaced fracture. Multilevel degenerative changes of the spine. IMPRESSION: 1. No acute intracranial abnormality. 2. Limited evaluation of the chest, abdomen, pelvis due to motion artifact and noncontrast study. 3. No acute intrathoracic abnormality. 4. Cholelithiasis. 5. Interval resolution biliary ductal dilatation status post percutaneous transhepatic biliary drainage catheter. 6. Stable ascending aorta thoracic aneurysm. Recommend annual imaging followup by CTA or MRA. This recommendation follows 2010 ACCF/AHA/AATS/ACR/ASA/SCA/SCAI/SIR/STS/SVM Guidelines for the Diagnosis and Management of Patients with Thoracic Aortic Disease. Circulation. 2010; [id number]. Aortic aneurysm NOS (ICD10-I71.9) Electronically Signed   By: Thomasena Edis  Mckinley Jewel M.D.   On: 06/24/2021 21:37   DG Chest Port 1 View  Result Date: 06/19/2021 CLINICAL DATA:  Questionable sepsis. EXAM: PORTABLE CHEST 1 VIEW COMPARISON:  Chest radiograph dated 06/06/2017. FINDINGS: No focal consolidation pleural effusion or pneumothorax. Stable cardiac silhouette. No acute osseous pathology. IMPRESSION: No active disease. Electronically Signed   By: Anner Crete M.D.   On: 07/02/2021 19:56    Procedures .Critical Care  Date/Time: 07/02/2021 11:35 PM Performed by: Sherwood Gambler, MD Authorized by: Sherwood Gambler, MD   Critical care provider statement:    Critical care time (minutes):  50   Critical care time was exclusive of:  Separately billable procedures and treating other  patients   Critical care was necessary to treat or prevent imminent or life-threatening deterioration of the following conditions:  Renal failure   Critical care was time spent personally by me on the following activities:  Discussions with consultants, evaluation of patient's response to treatment, examination of patient, ordering and performing treatments and interventions, ordering and review of laboratory studies, ordering and review of radiographic studies, pulse oximetry, re-evaluation of patient's condition, obtaining history from patient or surrogate and review of old charts   Medications Ordered in ED Medications  vancomycin (VANCOREADY) IVPB 1750 mg/350 mL (1,750 mg Intravenous New Bag/Given 06/24/2021 2323)  sodium bicarbonate 150 mEq in sterile water 1,150 mL infusion ( Intravenous New Bag/Given 07/15/2021 2307)  ceFEPIme (MAXIPIME) 2 g in sodium chloride 0.9 % 100 mL IVPB (has no administration in time range)  vancomycin variable dose per unstable renal function (pharmacist dosing) (has no administration in time range)  fentaNYL (SUBLIMAZE) injection 25-50 mcg (has no administration in time range)  heparin injection 5,000 Units (has no administration in time range)  docusate sodium (COLACE) capsule 100 mg (has no administration in time range)  polyethylene glycol (MIRALAX / GLYCOLAX) packet 17 g (has no administration in time range)  ceFEPIme (MAXIPIME) 2 g in sodium chloride 0.9 % 100 mL IVPB (0 g Intravenous Stopped 06/18/2021 2303)  metroNIDAZOLE (FLAGYL) IVPB 500 mg (0 mg Intravenous Stopped 07/11/2021 2303)  lactated ringers bolus 1,000 mL (0 mLs Intravenous Stopped 07/12/2021 2303)    And  lactated ringers bolus 1,000 mL (0 mLs Intravenous Stopped 07/05/2021 2303)    And  lactated ringers bolus 1,000 mL (0 mLs Intravenous Stopped 06/19/2021 2303)  fentaNYL (SUBLIMAZE) injection 50 mcg (50 mcg Intravenous Given 06/27/2021 2255)  sodium bicarbonate injection 100 mEq (100 mEq Intravenous Given 07/14/2021  2309)    ED Course  I have reviewed the triage vital signs and the nursing notes.  Pertinent labs & imaging results that were available during my care of the patient were reviewed by me and considered in my medical decision making (see chart for details).    MDM Rules/Calculators/A&P                           Patient is mottled and ill-appearing.  He is found to have significant lactic acidosis, metabolic acidosis with a pH of 6.9, kidney failure.  He is protecting his airway but is confused, likely from the uremia.  With such a sudden onset, I did briefly consider something like PE but he is not hypoxic and so I think that is less likely.  Obviously cannot get a CTA with this.  He is not having any significant urine output which is obviously concerning.  He was given 30 cc/KG IV fluids and was started on  a bicarb drip.  He was given broad antibiotics.  Scans did not show any obvious findings.  A quick bedside ultrasound was limited due to body habitus but I did not see an obvious pericardial effusion or right heart strain.  I discussed with the ICU who will admit. Final Clinical Impression(s) / ED Diagnoses Final diagnoses:  Acute kidney injury ([place])  Metabolic acidosis    Rx / DC Orders ED Discharge Orders     None        Sherwood Gambler, MD 07/04/2021 2337

## 2021-07-16 NOTE — ED Notes (Signed)
Bladder scan showed 0ml.  

## 2021-07-16 NOTE — ED Notes (Addendum)
Pt found on floor of bathroom shortly after he was wheeled to the bathroom at his request. Pt denies falling, states he just got down on the floor to lay down. Put back into wheelchair and back to triage for further assessment.

## 2021-07-16 NOTE — ED Notes (Signed)
Bear Hugger applied

## 2021-07-16 NOTE — Telephone Encounter (Signed)
Patient's wife called office - left voice mail: Patient seen by Dr. Burr Medico in while in hospital. He was d/c'd from hospital on Saturday. He is not doing well. Only able to to take sips of water and spoonfuls of food. Vomited twice today - all bile.  Dr.Feng informed: at her request, patient scheduled tomorrow AM for Labs/ appt with her/IVF.  Contacted Ms. Borza to provide appt information. Ms Wehr stated patient's symptoms became worse in 2 hours since call and that she called EMS. She stated ambulance leaving driveway at that time, taking patient to [facility] ED.  Dr. Burr Medico informed.

## 2021-07-16 NOTE — ED Notes (Signed)
Pt wheeled to the lobby bathroom from triage. Instructed pt to pull the string in the bathroom if he needs help or when he is done. Also expressed to pt the need of a urine sample pt given urine sample cup.

## 2021-07-16 NOTE — ED Triage Notes (Signed)
Pt recently dx with liver cancer, cirrhosis, and "cancer blocking all bile ducts" per EMS. Had drain placed last week, and now is having n/v, pain at incision site and throughout the abdomen, and decreased output from drain.

## 2021-07-16 NOTE — ED Provider Notes (Signed)
Emergency Medicine Provider Triage Evaluation Note  Phillip Saunders , a 70 y.o. male  was evaluated in triage.  Pt diagnosed with liver cancer 2/2 cirrhosis last week. Drain placed. Currently with NV and abd tenderness. Drain with decreased output.  Review of Systems  Positive: Abd pain, nausea Negative: Cp, sob  Physical Exam  There were no vitals taken for this visit. Gen:   Awake, apparent discomfort Resp:  Increased work of breathing. CTAB  MSK:   Moves extremities without difficulty  Other:  Ill appearing  Medical Decision Making  Medically screening exam initiated at 6:04 PM.  Appropriate orders placed.  Tayvin Medendorp was informed that the remainder of the evaluation will be completed by another provider, this initial triage assessment does not replace that evaluation, and the importance of remaining in the ED until their evaluation is complete.  Was taken to restroom by staff and fell onto floor. Very very sick-tachy [address] A, PA-C 06/19/2021 1832    Teressa Lower, MD 2021/08/12 [phone]

## 2021-07-16 NOTE — Sepsis Progress Note (Signed)
Notified bedside nurse of need to order repeat lactic acid. Previous 2 results were >11, pt has received 3 liters of IVFs, and antibiotics as documented.  Awaiting response.

## 2021-07-17 ENCOUNTER — Ambulatory Visit: Payer: Medicare HMO

## 2021-07-17 LAB — BASIC METABOLIC PANEL
BUN: 109 mg/dL — ABNORMAL HIGH (ref 8–23)
CO2: <7 mmol/L — ABNORMAL LOW (ref 22–32)
Calcium: 10.2 mg/dL (ref 8.9–10.3)
Chloride: 87 mmol/L — ABNORMAL LOW (ref 98–111)
Creatinine, Ser: 5.3 mg/dL — ABNORMAL HIGH (ref 0.61–1.24)
GFR, Estimated: 11 mL/min — ABNORMAL LOW (ref >60)
Glucose, Bld: 234 mg/dL — ABNORMAL HIGH (ref 70–99)
Potassium: 6 mmol/L — ABNORMAL HIGH (ref 3.5–5.1)
Sodium: 133 mmol/L — ABNORMAL LOW (ref 135–145)

## 2021-07-17 LAB — MAGNESIUM: Magnesium: 4.2 mg/dL — ABNORMAL HIGH (ref 1.7–2.4)

## 2021-07-17 LAB — CBC
HCT: 52.5 % — ABNORMAL HIGH (ref 39.0–52.0)
Hemoglobin: 16.6 g/dL (ref 13.0–17.0)
MCH: 32.6 pg (ref 26.0–34.0)
MCHC: 31.6 g/dL (ref 30.0–36.0)
MCV: 103.1 fL — ABNORMAL HIGH (ref 80.0–100.0)
Platelets: 282 10*3/uL (ref 150–400)
RBC: 5.09 MIL/uL (ref 4.22–5.81)
RDW: 13.9 % (ref 11.5–15.5)
WBC: 26.2 10*3/uL — ABNORMAL HIGH (ref 4.0–10.5)
nRBC: 0 % (ref 0.0–0.2)

## 2021-07-17 LAB — PHOSPHORUS: Phosphorus: >30 mg/dL — ABNORMAL HIGH (ref 2.5–4.6)

## 2021-07-17 MED ORDER — MORPHINE SULFATE (PF) 2 MG/ML IV SOLN
1.0000 mg | INTRAVENOUS | Status: DC | PRN
  Administered 2021-07-17: 2 mg via INTRAVENOUS
  Filled 2021-07-17: qty 1

## 2021-07-19 NOTE — ED Notes (Signed)
Time of death announced at 0529 2021-08-09 by Dr. Burr Medico.

## 2021-07-19 NOTE — Progress Notes (Signed)
Chaplain offered prayer and support at end of life.  Rev. Tamsen Snider Pager [phone]

## 2021-07-19 NOTE — Discharge Summary (Signed)
   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  He had percutaneous biliary drain placed which was complicated postprocedure pancreatitis & AKI , seen by oncology and discharged with plan for outpatient chemotherapy after multidisciplinary discussion.  Imaging also showed liver cirrhosis with findings suspicious for infiltrative hepatocellular carcinoma. He was readmitted within 2 days with vomiting, shortness of breath and found to have AKI with severe metabolic acidosis and lactic acidosis Venous pH was 6.97, sodium 127, sugars elevated at 400 range, new renal failure with BUN 119 creatinine 5.30, lactate more than 11, mild leukocytosis and severe polycythemia with hemoglobin of 20.2  Pertinent  Medical History  Hypertension-irbesartan stopped last admission Cirrhosis Diabetes type 2 -metformin stopped last admission  Significant Hospital Events: Including procedures, antibiotic start and stop dates in addition to other pertinent events     COURSE:  He has developed new onset renal failure.  He had also developed AKI post percutaneous biliary drain which was felt to be due to ARB and increased biliary output.  He appears severely dehydrated and polycythemic suggesting hemoconcentration.  He has mild leukocytosis and biliary sepsis is possible.  Lactic acidosis could be related to occult shock or delayed clearance from liver failure -Received 3 L of fluids, use empiric antibiotics cefepime and Flagyl to cover biliary source -started bicarbonate drip at 150 cc an hour  -Not a candidate for surgery in his current state -DNR issued based on goals of care discussion with  wife/son He passed away in the ED at 0529  Cause of death : Acute kidney injury, metabolic acidosis, cholangiocarcinoma  Dylanie Quesenberry V. Elsworth Soho MD  Aug 06, 2021

## 2021-07-19 DEATH — deceased

## 2021-07-21 LAB — CULTURE, BLOOD (ROUTINE X 2)
Culture: NO GROWTH
Culture: NO GROWTH

## 2022-04-18 IMAGING — DX DG CHEST 1V PORT
1 series · 1 of 1 positions shown · non-contrast
Comparison: Chest radiograph dated 06/06/2017.

CLINICAL DATA: Questionable sepsis.

EXAM:
PORTABLE CHEST 1 VIEW

[chest ap]
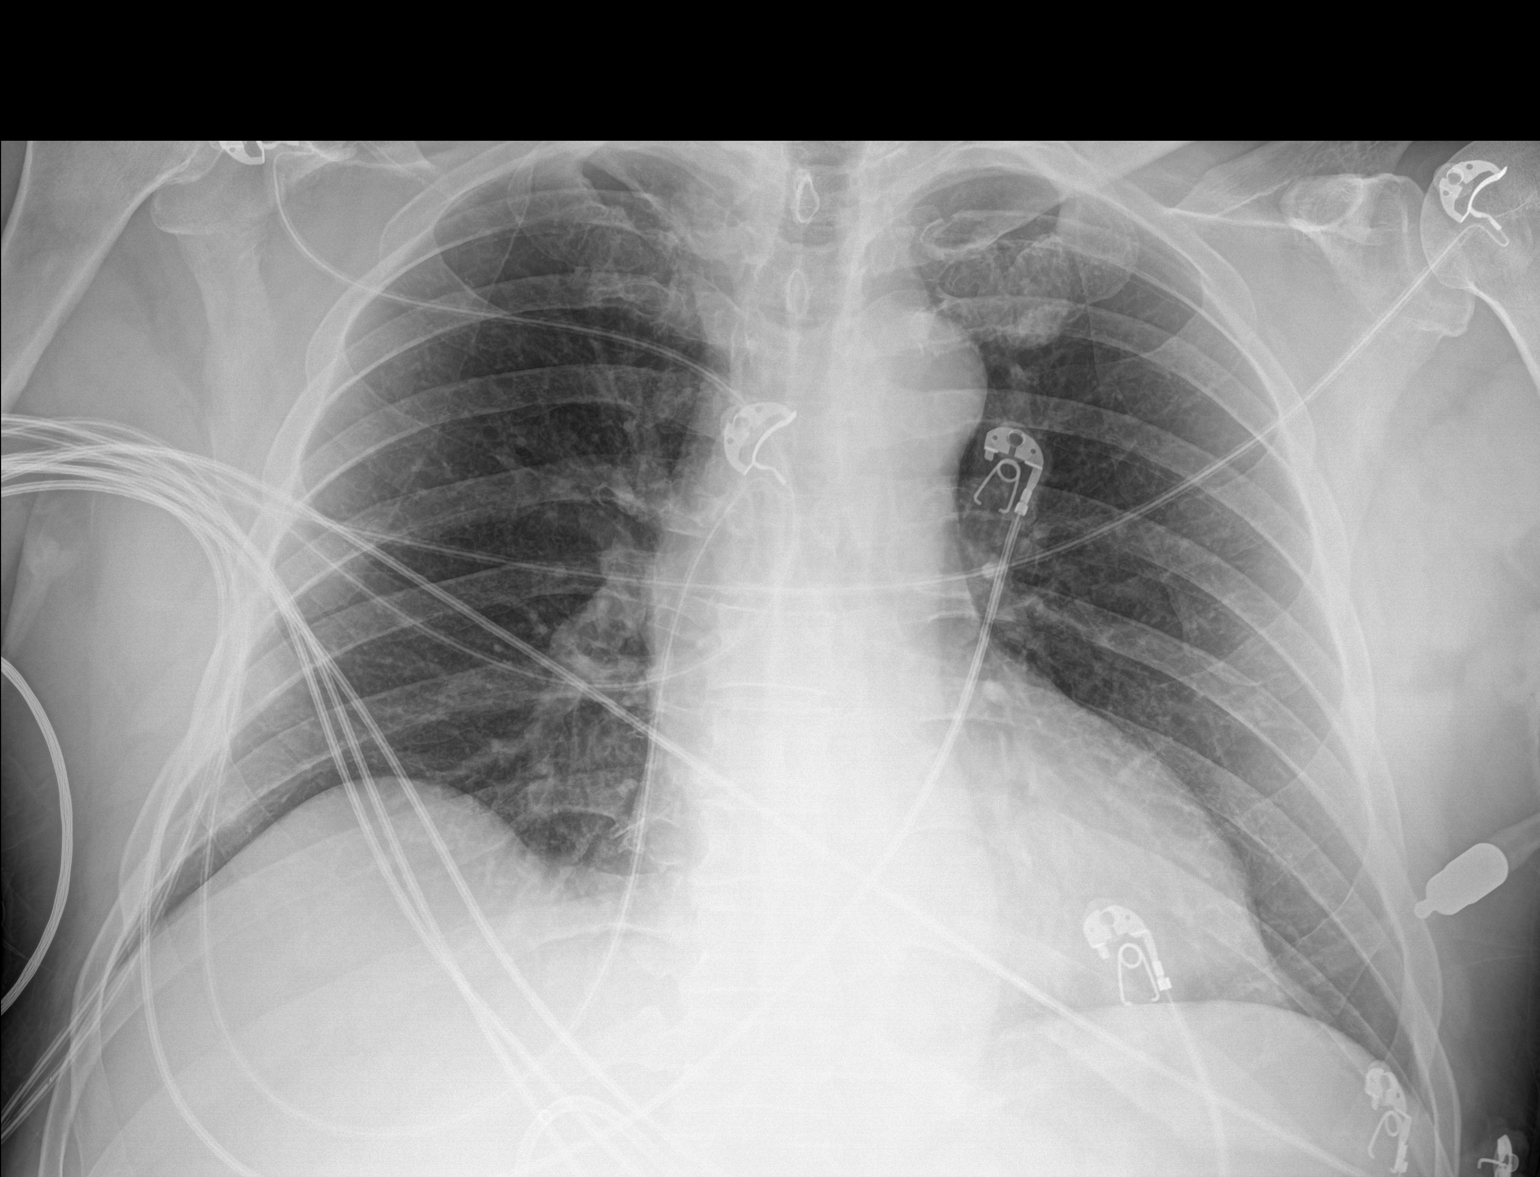

[1 of 1 positions shown; findings below may reference images not displayed]

FINDINGS: No focal consolidation pleural effusion or pneumothorax. Stable
cardiac silhouette. No acute osseous pathology.
IMPRESSION: No active disease.

## 2022-04-18 IMAGING — CT CT ABD-PELV W/O CM
2 of 4 series · 13 of 46 positions shown, 15 images · non-contrast
Comparison: None.

CLINICAL DATA: Unknown mental status change.

EXAM:
CT HEAD WITHOUT CONTRAST
CT CHEST, ABDOMEN AND PELVIS WITHOUT CONTRAST
TECHNIQUE: Contiguous axial images were obtained from the base of the skull
through the vertex without intravenous contrast.
Multidetector CT imaging of the chest, abdomen and pelvis was
performed following the standard protocol without IV contrast.

[Series 3: cap without · axial · non-contrast · 0.76mm/px · z∈[+637,+1192]mm · 10 of 131 slices shown, 12 images]
[im 10/131  soft-tissue]
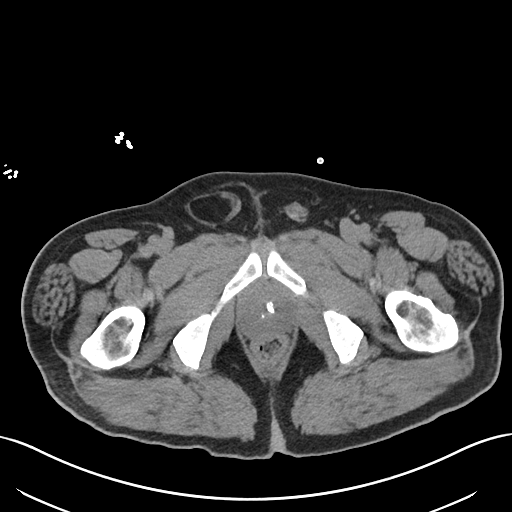
[im 10/131  bone]
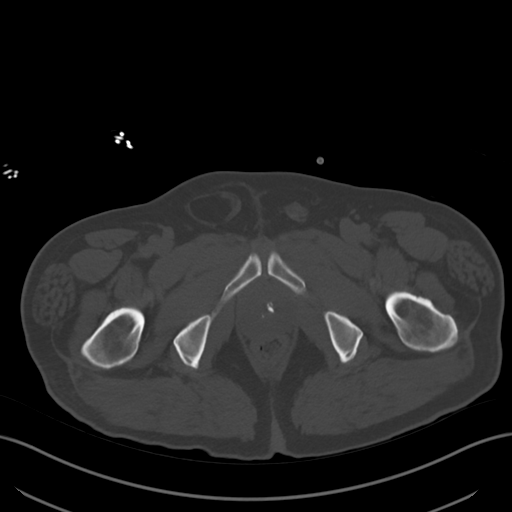
[im 19/131  soft-tissue]
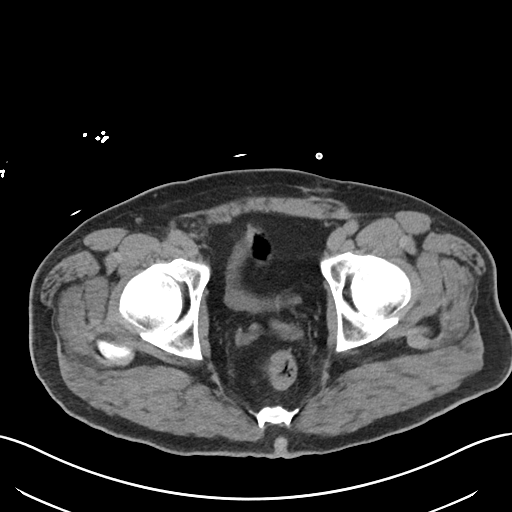
[im 38/131  soft-tissue]
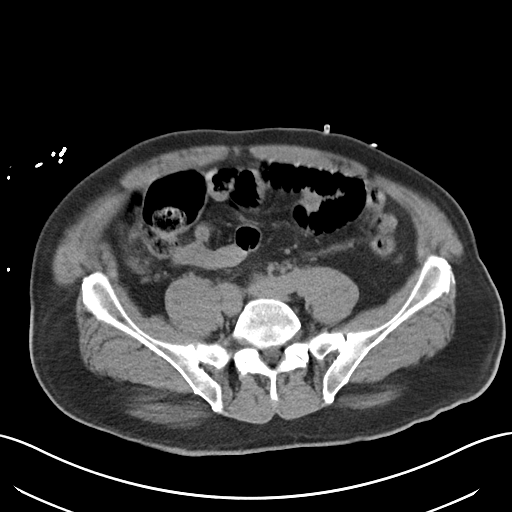
[im 47/131  soft-tissue]
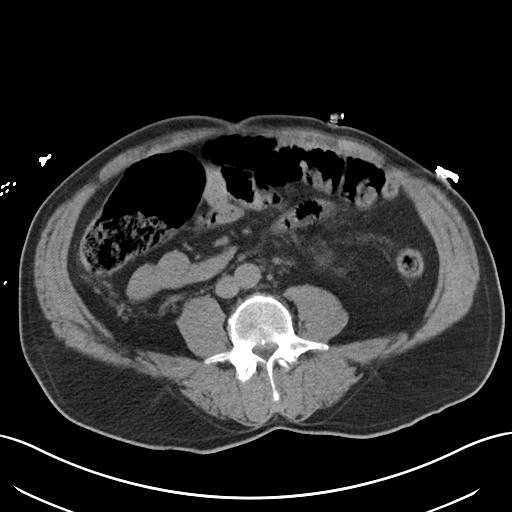
[im 56/131  soft-tissue]
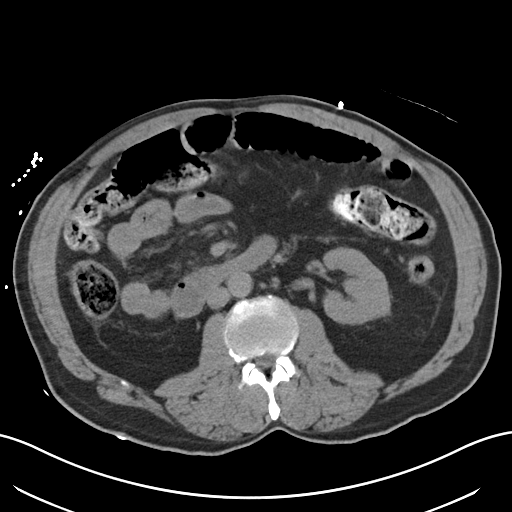
[im 75/131  soft-tissue]
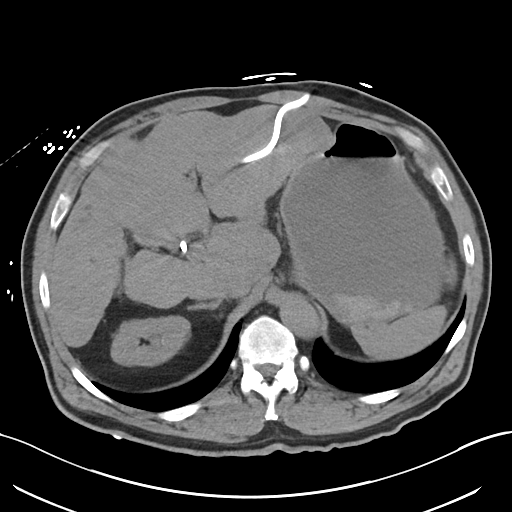
[im 84/131  soft-tissue]
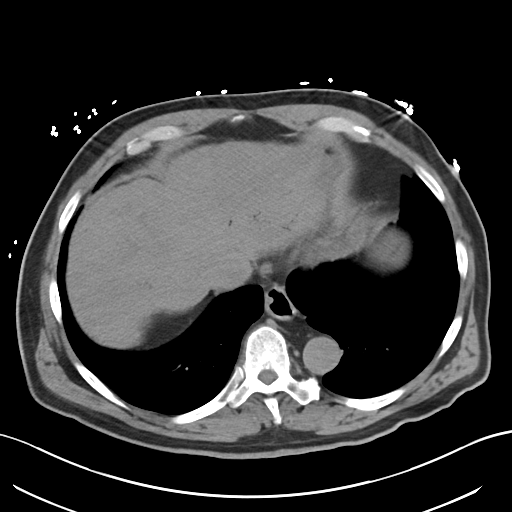
[im 93/131  soft-tissue]
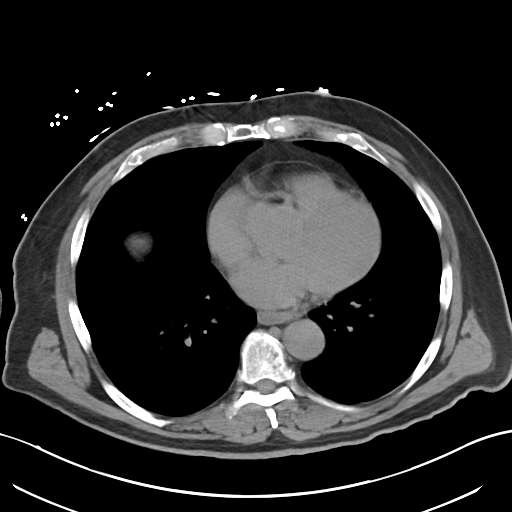
[im 112/131  soft-tissue]
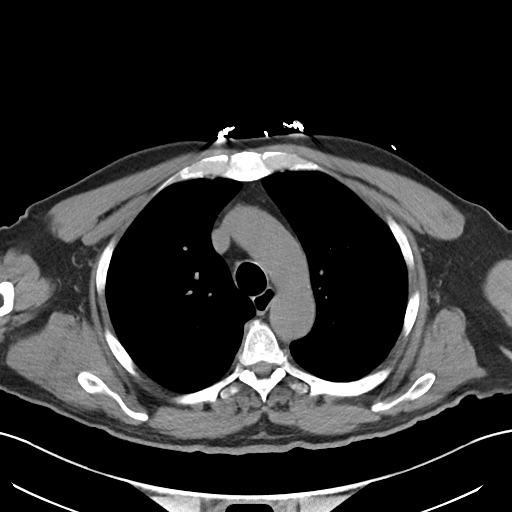
[im 112/131  bone]
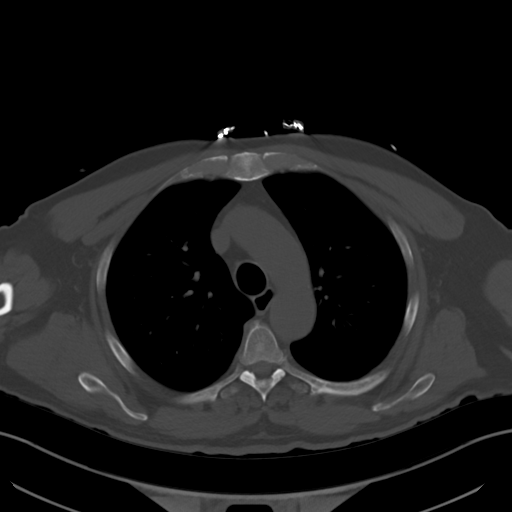
[im 121/131  soft-tissue]
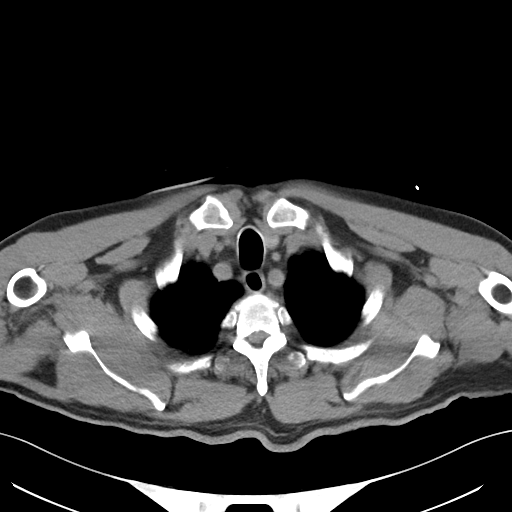

[Series 6: cor · coronal · 0.81mm/px · 3 of 106 slices shown]
[im 36/106  soft-tissue]
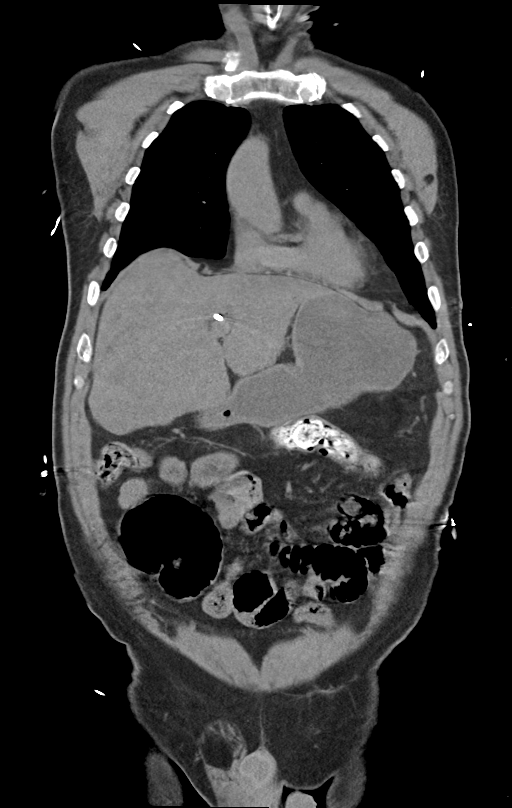
[im 47/106  soft-tissue]
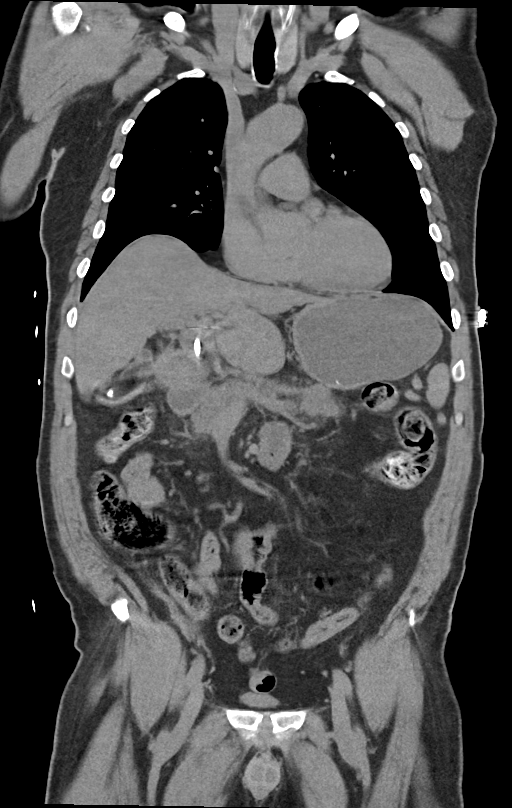
[im 59/106  soft-tissue]
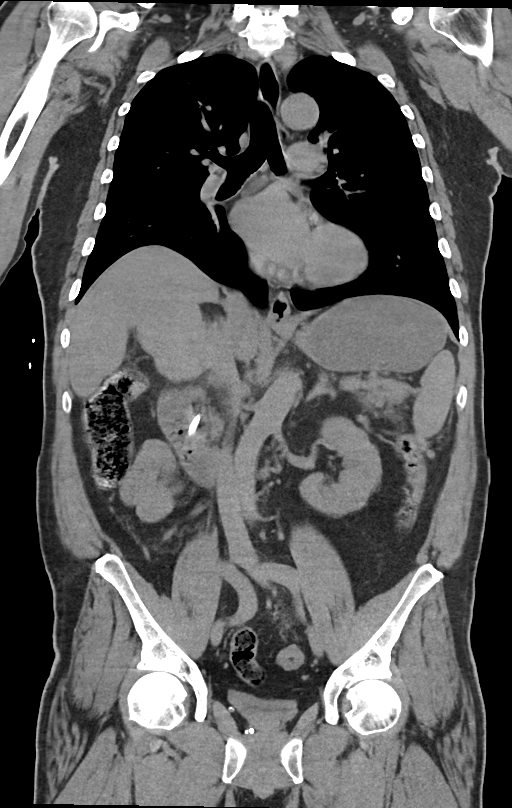

[13 of 46 positions shown; findings below may reference images not displayed]

FINDINGS: Brain:

No evidence of large-territorial acute infarction. No parenchymal
hemorrhage. No mass lesion. No extra-axial collection.

No mass effect or midline shift. No hydrocephalus. Basilar cisterns
are patent.

Vascular: No hyperdense vessel.

Skull: No acute fracture or focal lesion.

Sinuses/Orbits: Mucosal thickening of left maxillary sinus.
Paranasal sinuses and mastoid air cells are clear. The orbits are
unremarkable.

Other: None.

CT CHEST FINDINGS

Cardiovascular: Normal heart size. No significant pericardial
effusion. Stable enlarged caliber of the ascending aorta measuring
up to 4.4 cm. The descending thoracic aorta is normal in caliber. No
atherosclerotic plaque of the thoracic aorta. No coronary artery
calcifications.

Mediastinum/Nodes: No enlarged mediastinal, hilar, or axillary lymph
nodes. Thyroid gland, trachea, and esophagus demonstrate no
significant findings.

Lungs/Pleura: No focal consolidation. No pulmonary nodule. No
pulmonary mass. No pleural effusion. No pneumothorax.

Musculoskeletal:

No chest wall abnormality.

No suspicious lytic or blastic osseous lesions. No acute displaced
fracture. Multilevel degenerative changes of the spine.

CT ABDOMEN AND PELVIS FINDINGS

Hepatobiliary: No focal liver abnormality. Calcified gallstone
within the gallbladder lumen. Gas noted within the gallbladder
lumen. Percutaneous transhepatic biliary drainage catheter with tip
terminating within the second portion/third portion of the duodenum.
Associated pneumobilia. No biliary dilatation.

Pancreas: No focal lesion. Normal pancreatic contour. No surrounding
inflammatory changes. No main pancreatic ductal dilatation.

Spleen:

Normal in size without focal abnormality.

Adrenals/Urinary Tract:

No adrenal nodule bilaterally.

Bilateral renal cortical scarring. Punctate bilateral
nephrolithiasis. No hydronephrosis. No definite contour-deforming
renal mass.

No ureterolithiasis or hydroureter.

The urinary bladder is decompressed.

Stomach/Bowel: Stomach is within normal limits. No evidence of bowel
wall thickening or dilatation. The appendix not definitely
identified. No right lower quadrant inflammatory changes suggest
acute appendicitis. Motion artifact within right lower quadrant is
noted.

Vascular/Lymphatic: No abdominal aorta or iliac aneurysm. Mild
atherosclerotic plaque of the aorta and its branches. No abdominal,
pelvic, or inguinal lymphadenopathy.

Reproductive: Prostate is prominent.

Other: No intraperitoneal free fluid. No intraperitoneal free gas.
No organized fluid collection.

Musculoskeletal:

Small fat containing right inguinal hernia.

No suspicious lytic or blastic osseous lesions. No acute displaced
fracture. Multilevel degenerative changes of the spine.
IMPRESSION: 1. No acute intracranial abnormality.
2. Limited evaluation of the chest, abdomen, pelvis due to motion
artifact and noncontrast study.
3. No acute intrathoracic abnormality.
4. Cholelithiasis.
5. Interval resolution biliary ductal dilatation status post
percutaneous transhepatic biliary drainage catheter.
6. Stable ascending aorta thoracic aneurysm. Recommend annual
imaging followup by CTA or MRA. This recommendation follows 4060
ACCF/AHA/AATS/ACR/ASA/SCA/DEMJAHA/MALLAMAS/ZHANYUAN/ATO Guidelines for the
Diagnosis and Management of Patients with Thoracic Aortic Disease.
Circulation. 4060; 121: [id number]. Aortic aneurysm NOS (4VP6A-NIP.G)

## 2022-04-18 IMAGING — CT CT HEAD W/O CM
4 series · 14 of 47 positions shown, 16 images · non-contrast
Comparison: None.

CLINICAL DATA: Unknown mental status change.

EXAM:
CT HEAD WITHOUT CONTRAST
CT CHEST, ABDOMEN AND PELVIS WITHOUT CONTRAST
TECHNIQUE: Contiguous axial images were obtained from the base of the skull
through the vertex without intravenous contrast.
Multidetector CT imaging of the chest, abdomen and pelvis was
performed following the standard protocol without IV contrast.

[Series 3: head wo · axial · 0.43mm/px · z∈[+1330,+1446]mm · 6 of 33 slices shown, 8 images]
[im 5/33  brain]
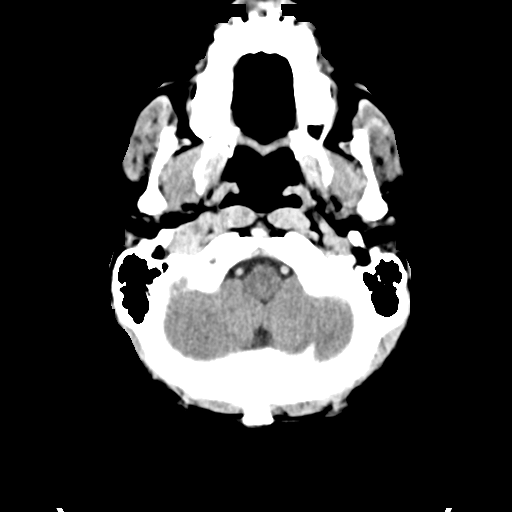
[im 5/33  bone]
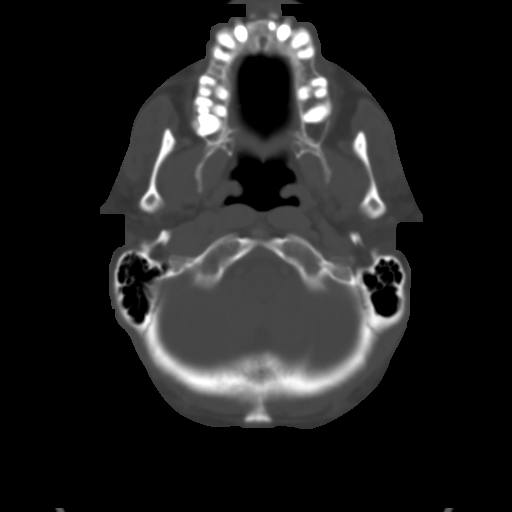
[im 10/33  brain]
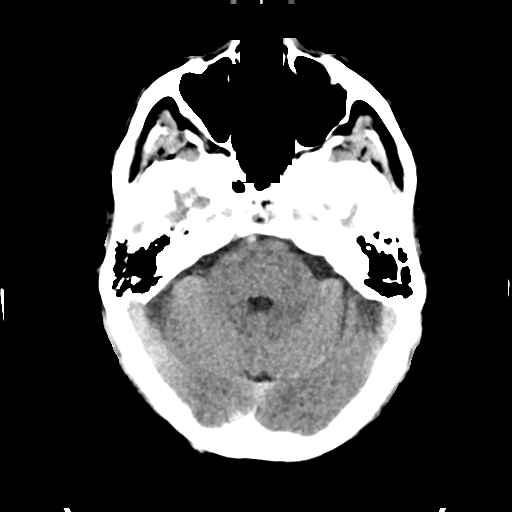
[im 14/33  brain]
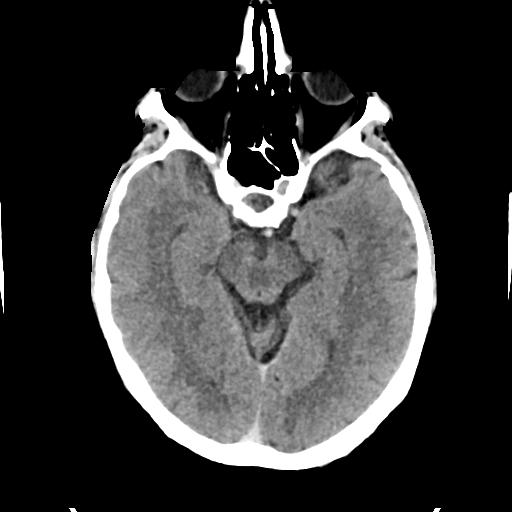
[im 19/33  brain]
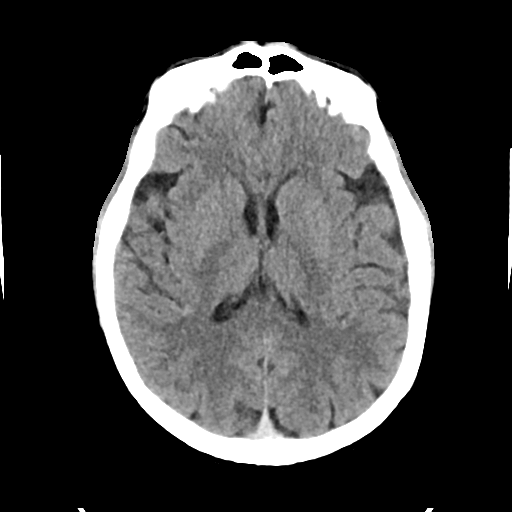
[im 23/33  brain]
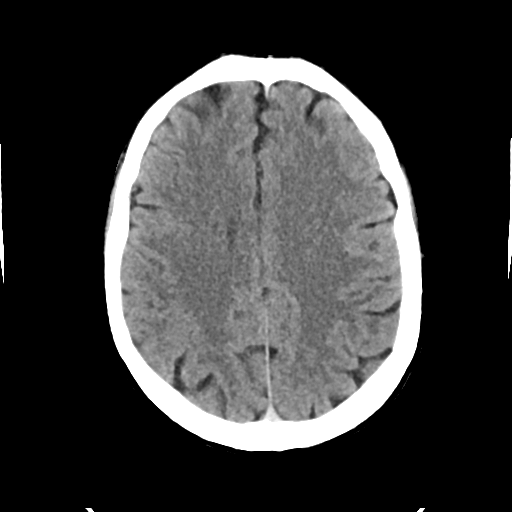
[im 23/33  bone]
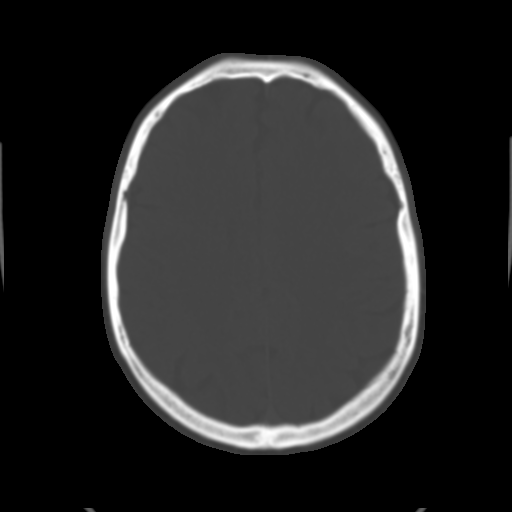
[im 28/33  brain]
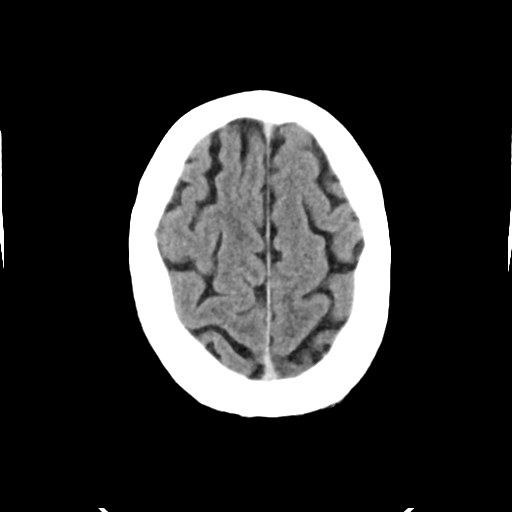

[Series 4: head bone · axial · 0.43mm/px · z∈[+1326,+1342]mm · 2 of 85 slices shown]
[im 9/85  bone]
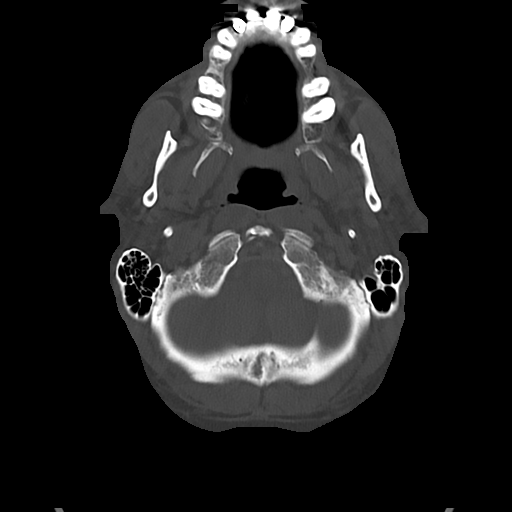
[im 17/85  bone]
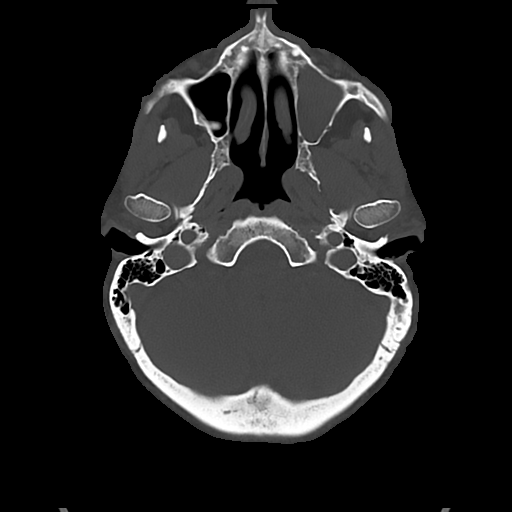

[Series 5: cor soft · coronal · 0.34mm/px · 3 of 71 slices shown]
[im 24/71  brain]
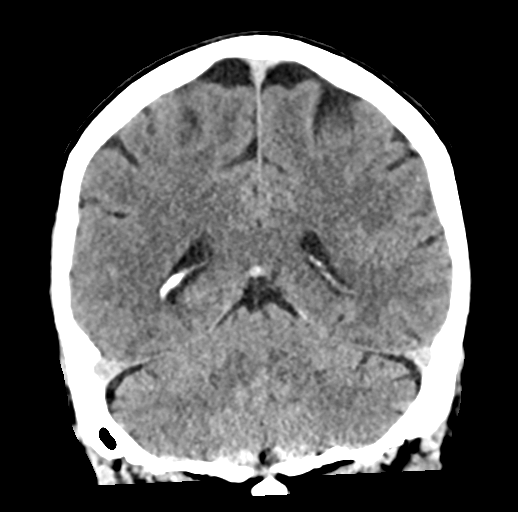
[im 32/71  brain]
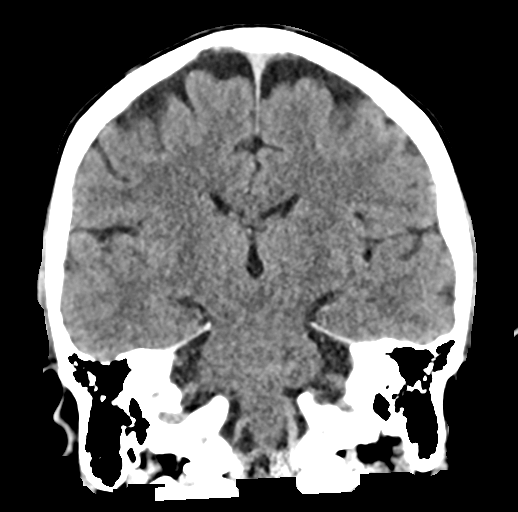
[im 39/71  brain]
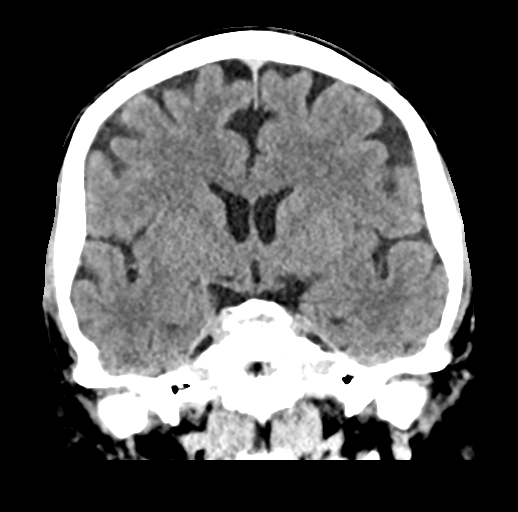

[Series 6: sag soft · sagittal · 0.34mm/px · 3 of 56 slices shown]
[im 19/56  brain]
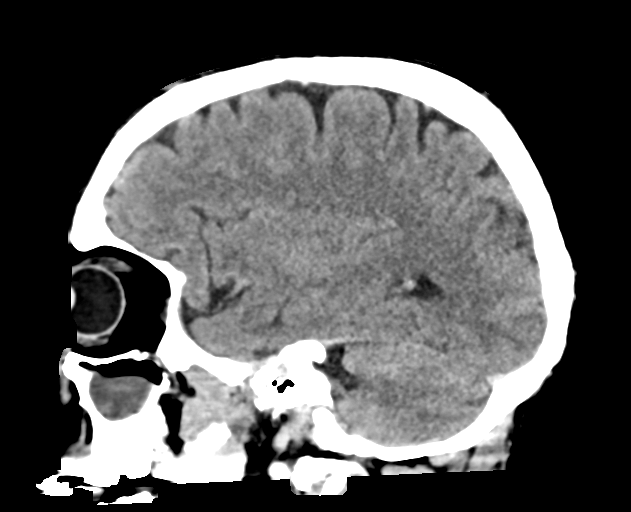
[im 28/56  brain]
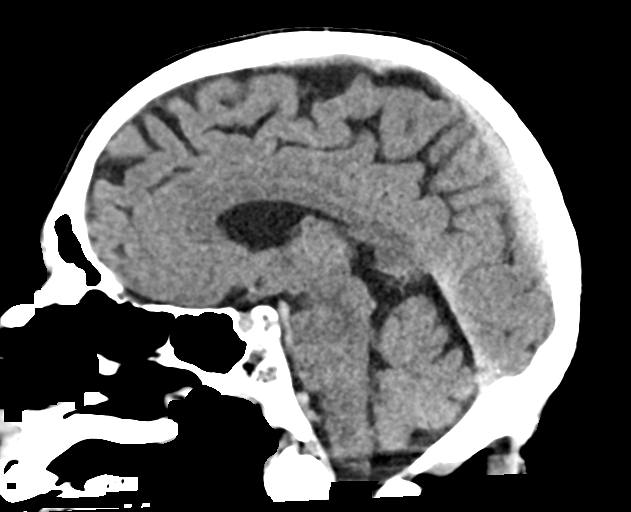
[im 37/56  brain]
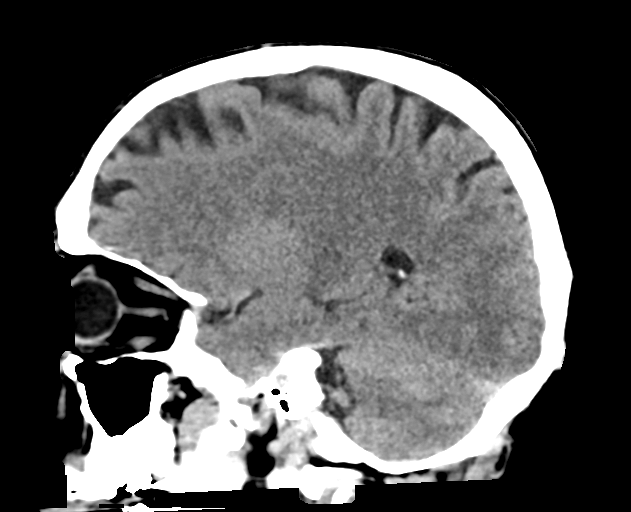

[14 of 47 positions shown; findings below may reference images not displayed]

FINDINGS: Brain:

No evidence of large-territorial acute infarction. No parenchymal
hemorrhage. No mass lesion. No extra-axial collection.

No mass effect or midline shift. No hydrocephalus. Basilar cisterns
are patent.

Vascular: No hyperdense vessel.

Skull: No acute fracture or focal lesion.

Sinuses/Orbits: Mucosal thickening of left maxillary sinus.
Paranasal sinuses and mastoid air cells are clear. The orbits are
unremarkable.

Other: None.

CT CHEST FINDINGS

Cardiovascular: Normal heart size. No significant pericardial
effusion. Stable enlarged caliber of the ascending aorta measuring
up to 4.4 cm. The descending thoracic aorta is normal in caliber. No
atherosclerotic plaque of the thoracic aorta. No coronary artery
calcifications.

Mediastinum/Nodes: No enlarged mediastinal, hilar, or axillary lymph
nodes. Thyroid gland, trachea, and esophagus demonstrate no
significant findings.

Lungs/Pleura: No focal consolidation. No pulmonary nodule. No
pulmonary mass. No pleural effusion. No pneumothorax.

Musculoskeletal:

No chest wall abnormality.

No suspicious lytic or blastic osseous lesions. No acute displaced
fracture. Multilevel degenerative changes of the spine.

CT ABDOMEN AND PELVIS FINDINGS

Hepatobiliary: No focal liver abnormality. Calcified gallstone
within the gallbladder lumen. Gas noted within the gallbladder
lumen. Percutaneous transhepatic biliary drainage catheter with tip
terminating within the second portion/third portion of the duodenum.
Associated pneumobilia. No biliary dilatation.

Pancreas: No focal lesion. Normal pancreatic contour. No surrounding
inflammatory changes. No main pancreatic ductal dilatation.

Spleen:

Normal in size without focal abnormality.

Adrenals/Urinary Tract:

No adrenal nodule bilaterally.

Bilateral renal cortical scarring. Punctate bilateral
nephrolithiasis. No hydronephrosis. No definite contour-deforming
renal mass.

No ureterolithiasis or hydroureter.

The urinary bladder is decompressed.

Stomach/Bowel: Stomach is within normal limits. No evidence of bowel
wall thickening or dilatation. The appendix not definitely
identified. No right lower quadrant inflammatory changes suggest
acute appendicitis. Motion artifact within right lower quadrant is
noted.

Vascular/Lymphatic: No abdominal aorta or iliac aneurysm. Mild
atherosclerotic plaque of the aorta and its branches. No abdominal,
pelvic, or inguinal lymphadenopathy.

Reproductive: Prostate is prominent.

Other: No intraperitoneal free fluid. No intraperitoneal free gas.
No organized fluid collection.

Musculoskeletal:

Small fat containing right inguinal hernia.

No suspicious lytic or blastic osseous lesions. No acute displaced
fracture. Multilevel degenerative changes of the spine.
IMPRESSION: 1. No acute intracranial abnormality.
2. Limited evaluation of the chest, abdomen, pelvis due to motion
artifact and noncontrast study.
3. No acute intrathoracic abnormality.
4. Cholelithiasis.
5. Interval resolution biliary ductal dilatation status post
percutaneous transhepatic biliary drainage catheter.
6. Stable ascending aorta thoracic aneurysm. Recommend annual
imaging followup by CTA or MRA. This recommendation follows 4060
ACCF/AHA/AATS/ACR/ASA/SCA/DEMJAHA/MALLAMAS/ZHANYUAN/ATO Guidelines for the
Diagnosis and Management of Patients with Thoracic Aortic Disease.
Circulation. 4060; 121: [id number]. Aortic aneurysm NOS (4VP6A-NIP.G)
